# Patient Record
Sex: Female | Born: 1960 | Race: White | Hispanic: No | Marital: Married | Smoking: Never smoker
Health system: Southern US, Community
[De-identification: ages and names within clinical notes are randomized; demographics above are authoritative.]

## PROBLEM LIST (undated history)

## (undated) DIAGNOSIS — R51 Headache: Secondary | ICD-10-CM

## (undated) DIAGNOSIS — N200 Calculus of kidney: Secondary | ICD-10-CM

---

## 2000-08-09 ENCOUNTER — Emergency Department (HOSPITAL_COMMUNITY): Admission: EM | Admit: 2000-08-09 | Discharge: 2000-08-09 | Payer: Self-pay | Admitting: Emergency Medicine

## 2000-08-09 ENCOUNTER — Encounter: Payer: Self-pay | Admitting: Emergency Medicine

## 2002-02-01 ENCOUNTER — Encounter: Payer: Self-pay | Admitting: Otolaryngology

## 2002-02-01 ENCOUNTER — Ambulatory Visit (HOSPITAL_COMMUNITY): Admission: RE | Admit: 2002-02-01 | Discharge: 2002-02-01 | Payer: Self-pay | Admitting: Otolaryngology

## 2007-08-09 ENCOUNTER — Emergency Department (HOSPITAL_COMMUNITY): Admission: EM | Admit: 2007-08-09 | Discharge: 2007-08-09 | Payer: Self-pay | Admitting: Emergency Medicine

## 2009-10-16 ENCOUNTER — Emergency Department (HOSPITAL_COMMUNITY): Admission: EM | Admit: 2009-10-16 | Discharge: 2009-10-17 | Payer: Self-pay | Admitting: Emergency Medicine

## 2009-10-27 ENCOUNTER — Ambulatory Visit (HOSPITAL_COMMUNITY): Admission: RE | Admit: 2009-10-27 | Discharge: 2009-10-27 | Payer: Self-pay | Admitting: Otolaryngology

## 2010-04-22 ENCOUNTER — Ambulatory Visit
Admission: RE | Admit: 2010-04-22 | Discharge: 2010-04-22 | Payer: Self-pay | Source: Home / Self Care | Attending: Otolaryngology | Admitting: Otolaryngology

## 2011-01-04 LAB — COMPREHENSIVE METABOLIC PANEL
ALT: 27
AST: 20
Albumin: 3.3 — ABNORMAL LOW
Calcium: 9
GFR calc Af Amer: >60
Potassium: 3.7
Sodium: 139
Total Protein: 6.5

## 2011-01-04 LAB — CBC
MCHC: 35.1
RDW: 14.3

## 2011-01-04 LAB — URINE CULTURE

## 2011-01-04 LAB — URINALYSIS, ROUTINE W REFLEX MICROSCOPIC
Bilirubin Urine: NEGATIVE
Glucose, UA: 250 — AB
Hgb urine dipstick: NEGATIVE
Nitrite: POSITIVE — AB
Specific Gravity, Urine: 1.025
pH: 5

## 2011-01-04 LAB — DIFFERENTIAL
Eosinophils Absolute: 0.1
Eosinophils Relative: 1
Lymphs Abs: 1.8
Monocytes Absolute: 0.6
Monocytes Relative: 7

## 2011-01-04 LAB — URINE MICROSCOPIC-ADD ON

## 2011-01-04 LAB — PREGNANCY, URINE: Preg Test, Ur: NEGATIVE

## 2011-03-04 ENCOUNTER — Other Ambulatory Visit: Payer: Self-pay | Admitting: Obstetrics and Gynecology

## 2011-03-04 DIAGNOSIS — R928 Other abnormal and inconclusive findings on diagnostic imaging of breast: Secondary | ICD-10-CM

## 2011-03-17 ENCOUNTER — Ambulatory Visit
Admission: RE | Admit: 2011-03-17 | Discharge: 2011-03-17 | Disposition: A | Discharge status: Home / Self Care | Payer: PRIVATE HEALTH INSURANCE | Source: Ambulatory Visit | Attending: Obstetrics and Gynecology | Admitting: Obstetrics and Gynecology

## 2011-03-17 ENCOUNTER — Other Ambulatory Visit: Payer: Self-pay | Admitting: Obstetrics and Gynecology

## 2011-03-17 DIAGNOSIS — R928 Other abnormal and inconclusive findings on diagnostic imaging of breast: Secondary | ICD-10-CM

## 2012-02-02 ENCOUNTER — Telehealth: Payer: Self-pay

## 2012-02-02 NOTE — Telephone Encounter (Signed)
LMOM to call.

## 2012-02-17 ENCOUNTER — Telehealth: Payer: Self-pay | Admitting: Gastroenterology

## 2012-02-17 NOTE — Telephone Encounter (Signed)
Patient was calling to be triaged. She received a letter from DS. Please call patient on Monday at [phone]

## 2012-02-20 ENCOUNTER — Telehealth: Payer: Self-pay

## 2012-02-20 NOTE — Telephone Encounter (Signed)
See triage dated 02/20/2012.

## 2012-02-20 NOTE — Telephone Encounter (Signed)
See triage 02/20/2012.

## 2012-02-20 NOTE — Telephone Encounter (Signed)
Returned pt's call from 02/17/2012. LMOM at [phone] for a return call.

## 2012-02-21 ENCOUNTER — Telehealth: Payer: Self-pay | Admitting: *Deleted

## 2012-02-21 NOTE — Telephone Encounter (Signed)
Pt was referred by Dr. McGough for screening colonoscopy/  

## 2012-02-21 NOTE — Telephone Encounter (Signed)
LMOM to call.

## 2012-02-21 NOTE — Telephone Encounter (Signed)
Sherry Pitts called today to be triaged for a colonoscopy. Please call her back. Thanks.

## 2012-02-21 NOTE — Telephone Encounter (Signed)
Returned pt's phone call. LMOM for her to return call.

## 2012-02-23 NOTE — Telephone Encounter (Signed)
LMOM to call.

## 2012-02-24 NOTE — Telephone Encounter (Signed)
LMOM to call.

## 2012-02-27 NOTE — Telephone Encounter (Signed)
Letter to pt

## 2012-02-27 NOTE — Telephone Encounter (Signed)
Letter to pt and PCP.  

## 2012-03-01 ENCOUNTER — Other Ambulatory Visit: Payer: Self-pay

## 2012-03-01 ENCOUNTER — Telehealth: Payer: Self-pay

## 2012-03-01 DIAGNOSIS — Z139 Encounter for screening, unspecified: Secondary | ICD-10-CM

## 2012-03-01 NOTE — Telephone Encounter (Signed)
Gastroenterology Pre-Procedure Form     Request Date: 03/01/2012     Requesting Physician: Dr. Regino Schultze     PATIENT INFORMATION:  Sherry Pitts is a 51 y.o., female (DOB=[birth year]).  PROCEDURE: Procedure(s) requested: colonoscopy Procedure Reason: screening for colon cancer  PATIENT REVIEW QUESTIONS: The patient reports the following:   1. Diabetes Melitis: no 2. Joint replacements in the past 12 months: no 3. Major health problems in the past 3 months: no 4. Has an artificial valve or MVP:no 5. Has been advised in past to take antibiotics in advance of a procedure like teeth cleaning: no}    MEDICATIONS & ALLERGIES:    Patient reports the following regarding taking any blood thinners:   Plavix? no Aspirin?no Coumadin?  no  Patient confirms/reports the following medications:  Current Outpatient Prescriptions  Medication Sig Dispense Refill  . Multiple Vitamin (MULTIVITAMIN) tablet Take 1 tablet by mouth daily.        Patient confirms/reports the following allergies:  Allergies  Allergen Reactions  . Ceclor (Cefaclor) Hives  . Penicillins Hives  . Sulfur Hives    Patient is appropriate to schedule for requested procedure(s): yes  AUTHORIZATION INFORMATION Primary Insurance:   ID #:  Group #:  Pre-Cert / Auth required: Pre-Cert / Auth #:   Secondary Insurance:   ID #:   Group #:  Pre-Cert / Auth required: Pre-Cert / Auth #:   No orders of the defined types were placed in this encounter.    SCHEDULE INFORMATION: Procedure has been scheduled as follows:  Date: 03/12/2012    Time: 12:30 PM  Location: [facility] Short Stay  This Gastroenterology Pre-Precedure Form is being routed to the following provider(s) for review: Jonette Eva, MD

## 2012-03-02 NOTE — Telephone Encounter (Signed)
PREPOPIK-DRINK WATER TO KEEP URINE LIGHT YELLOW.  PT SHOULD DROP OFF RX 3 DAYS PRIOR TO PROCEDURE.  

## 2012-03-05 MED ORDER — SOD PICOSULFATE-MAG OX-CIT ACD 10-3.5-12 MG-GM-GM PO PACK: 1.0000 | PACK | Freq: Once | ORAL | Status: DC

## 2012-03-06 NOTE — Telephone Encounter (Signed)
Rx was sent to [facility] pharmacy. LMOM for pt to come by office to pick up instructions and left message that we close tomorrow. Before I could finish this, pt called and rescheduled to 03/23/2012 at 11:15 AM. Selena Batten has been informed. I doing a new instructions with new time, etc and mailing to her.

## 2012-03-07 ENCOUNTER — Telehealth: Payer: Self-pay

## 2012-03-07 NOTE — Telephone Encounter (Signed)
Called [employer]  [employer] at [phone]. Spoke to JD then was trnasferred to [employer] and spoke to [employer] who said pre-cert is required for screening colonoscopy. PA number is [phone].

## 2012-03-22 MED ORDER — SODIUM CHLORIDE 0.45 % IV SOLN
INTRAVENOUS | Status: DC
  Administered 2012-03-23: 1000 mL via INTRAVENOUS

## 2012-03-23 ENCOUNTER — Ambulatory Visit (HOSPITAL_COMMUNITY)
Admission: RE | Admit: 2012-03-23 | Discharge: 2012-03-23 | Disposition: A | Discharge status: Home / Self Care | Payer: PRIVATE HEALTH INSURANCE | Source: Ambulatory Visit | Attending: Gastroenterology | Admitting: Gastroenterology

## 2012-03-23 ENCOUNTER — Encounter (HOSPITAL_COMMUNITY): Payer: Self-pay | Admitting: *Deleted

## 2012-03-23 ENCOUNTER — Encounter (HOSPITAL_COMMUNITY)
Admission: RE | Disposition: A | Discharge status: Home / Self Care | Payer: Self-pay | Source: Ambulatory Visit | Attending: Gastroenterology

## 2012-03-23 DIAGNOSIS — Z1211 Encounter for screening for malignant neoplasm of colon: Secondary | ICD-10-CM | POA: Insufficient documentation

## 2012-03-23 DIAGNOSIS — D128 Benign neoplasm of rectum: Secondary | ICD-10-CM | POA: Insufficient documentation

## 2012-03-23 DIAGNOSIS — D129 Benign neoplasm of anus and anal canal: Secondary | ICD-10-CM | POA: Insufficient documentation

## 2012-03-23 DIAGNOSIS — D126 Benign neoplasm of colon, unspecified: Secondary | ICD-10-CM | POA: Insufficient documentation

## 2012-03-23 DIAGNOSIS — K648 Other hemorrhoids: Secondary | ICD-10-CM | POA: Insufficient documentation

## 2012-03-23 DIAGNOSIS — Z139 Encounter for screening, unspecified: Secondary | ICD-10-CM

## 2012-03-23 HISTORY — PX: COLONOSCOPY: SHX5424

## 2012-03-23 HISTORY — DX: Headache: R51

## 2012-03-23 SURGERY — COLONOSCOPY
Anesthesia: Moderate Sedation

## 2012-03-23 MED ORDER — MIDAZOLAM HCL 5 MG/5ML IJ SOLN
INTRAMUSCULAR | Status: AC
  Filled 2012-03-23: qty 10

## 2012-03-23 MED ORDER — MEPERIDINE HCL 100 MG/ML IJ SOLN
INTRAMUSCULAR | Status: DC | PRN
  Administered 2012-03-23 (×3): 25 mg via INTRAVENOUS

## 2012-03-23 MED ORDER — SIMETHICONE 40 MG/0.6ML PO SUSP
ORAL | Status: DC | PRN
  Administered 2012-03-23: 11:00:00

## 2012-03-23 MED ORDER — MIDAZOLAM HCL 5 MG/5ML IJ SOLN
INTRAMUSCULAR | Status: DC | PRN
  Administered 2012-03-23 (×2): 1 mg via INTRAVENOUS
  Administered 2012-03-23 (×2): 2 mg via INTRAVENOUS
  Administered 2012-03-23: 1 mg via INTRAVENOUS

## 2012-03-23 MED ORDER — MEPERIDINE HCL 100 MG/ML IJ SOLN
INTRAMUSCULAR | Status: AC
  Filled 2012-03-23: qty 1

## 2012-03-23 NOTE — Op Note (Signed)
[facility] [address] [place], [address]   COLONOSCOPY PROCEDURE REPORT  PATIENT: Sherry Pitts, Sherry Pitts  MR#: [id number] BIRTHDATE: [birth year] , 51  yrs. old GENDER: Female ENDOSCOPIST: Jonette Eva, MD REFERRED [id number] Regino Schultze, M.D. PROCEDURE DATE:  03/23/2012 PROCEDURE:   Colonoscopy with biopsy and Colonoscopy with cold biopsy polypectomy INDICATIONS:Average risk patient for colon cancer and PT USES OTC HA RELIEF MEDS AND IBUPROFEN.. MEDICATIONS: Demerol 75 mg IV and Versed 6 mg IV  DESCRIPTION OF PROCEDURE:    Physical exam was performed.  Informed consent was obtained from the patient after explaining the benefits, risks, and alternatives to procedure.  The patient was connected to monitor and placed in left lateral position. Continuous oxygen was provided by nasal cannula and IV medicine administered through an indwelling cannula.  After administration of sedation and rectal exam, the patients rectum was intubated and the Pentax Colonoscope [phone]  colonoscope was advanced under direct visualization to the cecum.  The scope was removed slowly by carefully examining the color, texture, anatomy, and integrity mucosa on the way out.  The patient was recovered in endoscopy and discharged home in satisfactory condition.       COLON FINDINGS: Three sessile polyps were found in the rectum, at the cecum, and in the descending colon.  , Abnormal mucosa was found in the descending colon and sigmoid colon.  The mucosa was erythematous, ulcerated and had erosions.  Multiple biopsies were performed using cold forceps.  , The colon mucosa was otherwise normal.  , and Small internal hemorrhoids were found.  PREP QUALITY: good. CECAL W/D TIME: 15 minutes  COMPLICATIONS: None  ENDOSCOPIC IMPRESSION: SMALL COLON POLYPS MILD COLITIS LIKELY DUE TO NSAID COLOPATHY HEMORRHOIDS   RECOMMENDATIONS: AWAIT BIOPSY. IF NSAID INUDCUED CHANGES IN COLON, PT SHOULD  CONSIDER USING AN NSAID IN ANOTHER CLASS. HIGH FIBER DIET TCS IN 10 YEARS       _______________________________ eSignedJonette Eva, MD 03/23/2012 12:03 PM     PATIENT NAME:  Sherry Pitts, Sherry Pitts MR#: [id number]

## 2012-03-23 NOTE — H&P (Signed)
  Primary Care Physician:  Kirk Ruths, MD Primary Gastroenterologist:  Dr. Darrick Penna  Pre-Procedure History & Physical: HPI:  Sherry Pitts is a 51 y.o. female here for COLON CANCER SCREENING.  Past Medical History  Diagnosis Date  . Headache     migraines    History reviewed. No pertinent past surgical history.  Prior to Admission medications   Medication Sig Start Date End Date Taking? Authorizing Provider  Multiple Vitamin (MULTIVITAMIN) tablet Take 1 tablet by mouth daily.    Historical Provider, MD    Allergies as of 03/01/2012 - Review Complete 03/01/2012  Allergen Reaction Noted  . Ceclor (cefaclor) Hives 03/01/2012  . Penicillins Hives 03/01/2012  . Sulfur Hives 03/01/2012    History reviewed. No pertinent family history.  History   Social History  . Marital Status: Married    Spouse Name: N/A    Number of Children: N/A  . Years of Education: N/A   Occupational History  . Not on file.   Social History Main Topics  . Smoking status: Never Smoker   . Smokeless tobacco: Not on file  . Alcohol Use: No  . Drug Use: No  . Sexually Active: Yes   Other Topics Concern  . Not on file   Social History Narrative  . No narrative on file    Review of Systems: See HPI, otherwise negative ROS   Physical Exam: BP 124/75  Pulse 74  Temp 98 F (36.7 C) (Oral)  Resp 15  Ht 5\' 3"  (1.6 m)  Wt 160 lb (72.576 kg)  BMI 28.34 kg/m2  SpO2 97% General:   Alert,  pleasant and cooperative in NAD Head:  Normocephalic and atraumatic. Neck:  Supple; Lungs:  Clear throughout to auscultation.    Heart:  Regular rate and rhythm. Abdomen:  Soft, nontender and nondistended. Normal bowel sounds, without guarding, and without rebound.   Neurologic:  Alert and  oriented x4;  grossly normal neurologically.  Impression/Plan:     SCREENING  Plan:  1. TCS TODAY

## 2012-03-27 ENCOUNTER — Encounter (HOSPITAL_COMMUNITY): Payer: Self-pay | Admitting: Gastroenterology

## 2012-03-27 DIAGNOSIS — D126 Benign neoplasm of colon, unspecified: Secondary | ICD-10-CM

## 2012-03-27 DIAGNOSIS — K5289 Other specified noninfective gastroenteritis and colitis: Secondary | ICD-10-CM

## 2012-03-27 DIAGNOSIS — Z1211 Encounter for screening for malignant neoplasm of colon: Secondary | ICD-10-CM

## 2012-03-27 DIAGNOSIS — K648 Other hemorrhoids: Secondary | ICD-10-CM

## 2012-03-28 ENCOUNTER — Telehealth: Payer: Self-pay | Admitting: Gastroenterology

## 2012-03-28 NOTE — Telephone Encounter (Signed)
LMOM to call.

## 2012-03-28 NOTE — Telephone Encounter (Signed)
Please call pt. She had simple adenomas removed from her colon. HER COLON BIOPSIES SHOW CHANGES THAT MEAN THAT HER OTC HA MED AND IBUPROFEN ARE IRRITATING HER COLON. SHE SHOULD CONSIDER USING NSAIDS IN A DIFFERENT CLASS. SHE SHOULD DISCUSS THIS WITH DR. Regino Schultze. FOLLOW A HIGH FIBER DIET. TCS IN 10 YEARS.

## 2012-03-28 NOTE — Telephone Encounter (Signed)
Pt aware of results 

## 2012-03-28 NOTE — Telephone Encounter (Signed)
Path faxed to PCP, recall made 

## 2012-09-13 ENCOUNTER — Emergency Department (HOSPITAL_COMMUNITY)
Admission: EM | Admit: 2012-09-13 | Discharge: 2012-09-13 | Disposition: A | Discharge status: Home / Self Care | Payer: PRIVATE HEALTH INSURANCE | Attending: Emergency Medicine | Admitting: Emergency Medicine

## 2012-09-13 ENCOUNTER — Emergency Department (HOSPITAL_COMMUNITY): Payer: PRIVATE HEALTH INSURANCE

## 2012-09-13 ENCOUNTER — Encounter (HOSPITAL_COMMUNITY): Payer: Self-pay | Admitting: Emergency Medicine

## 2012-09-13 DIAGNOSIS — R109 Unspecified abdominal pain: Secondary | ICD-10-CM | POA: Insufficient documentation

## 2012-09-13 DIAGNOSIS — Z79899 Other long term (current) drug therapy: Secondary | ICD-10-CM | POA: Insufficient documentation

## 2012-09-13 DIAGNOSIS — R319 Hematuria, unspecified: Secondary | ICD-10-CM | POA: Insufficient documentation

## 2012-09-13 DIAGNOSIS — Z880 Allergy status to penicillin: Secondary | ICD-10-CM | POA: Insufficient documentation

## 2012-09-13 DIAGNOSIS — N898 Other specified noninflammatory disorders of vagina: Secondary | ICD-10-CM | POA: Insufficient documentation

## 2012-09-13 DIAGNOSIS — R300 Dysuria: Secondary | ICD-10-CM

## 2012-09-13 DIAGNOSIS — Z87442 Personal history of urinary calculi: Secondary | ICD-10-CM | POA: Insufficient documentation

## 2012-09-13 HISTORY — DX: Calculus of kidney: N20.0

## 2012-09-13 LAB — URINALYSIS, ROUTINE W REFLEX MICROSCOPIC
Leukocytes, UA: NEGATIVE
Protein, ur: NEGATIVE mg/dL
Urobilinogen, UA: 0.2 mg/dL (ref 0.0–1.0)

## 2012-09-13 LAB — WET PREP, GENITAL
Clue Cells Wet Prep HPF POC: NONE SEEN
Trich, Wet Prep: NONE SEEN
WBC, Wet Prep HPF POC: NONE SEEN

## 2012-09-13 NOTE — ED Notes (Signed)
Patient c/o urinary retention with blood noted in urine. Per patient hx of kidney stones. Patient reports pressure in lower abd. Patient seen by Pcp 3 weeks ago in which she states he thought it was a kidney stone. Patient denies having passed a kidney stone since.

## 2012-09-13 NOTE — ED Notes (Signed)
Pt presents with " pain in urethra". Pt states was seen in PCP's office 3 weeks ago for rt flank pain. Denies UTI diagnosis at that time. Pt reports having kidney stones in past. States has pressure in bladder and has " bleeding " at this time. Post menopausal x 4 yrs. Denies fever, N/V at this time. NAD noted.

## 2012-09-13 NOTE — ED Provider Notes (Signed)
History     CSN: [id number]  Arrival date & time 09/13/12  1518   First MD Initiated Contact with Patient 09/13/12 1630      Chief Complaint  Patient presents with  . Urinary Retention  . Hematuria    HPI Pt was seen at 1700.  Per pt, c/o gradual onset and persistence of constant lower abd "pain" that began 3 weeks ago. Pt describes the pain as "pressure." Has been associated with hematuria and dysuria. States she was eval by her PMD 2 weeks ago for same, dx "possible kidney stone" and "possible UTI," was tx with cipro x10 days. Stats she "has at least a week of pills left."  States she is now having vaginal discharge since being on the antibiotics.  Denies flank/back pain, no N/V/D, no fevers, no CP/SOB, no rash.      Past Medical History  Diagnosis Date  . Headache(784.0)     migraines  . Kidney stone     Past Surgical History  Procedure Laterality Date  . Colonoscopy  03/23/2012    Procedure: COLONOSCOPY;  Surgeon: West Bali, MD;  Location: [facility];  Service: Endoscopy;  Laterality: N/A;  12:30 -rescheduled to 11:15 AM Doris notified pt    Family History  Problem Relation Age of Onset  . Heart failure Other   . Hypertension Other     History  Substance Use Topics  . Smoking status: Never Smoker   . Smokeless tobacco: Never Used  . Alcohol Use: No    OB History   Grav Para Term Preterm Abortions TAB SAB Ect Mult Living   2 2 2       2       Review of Systems ROS: Statement: All systems negative except as marked or noted in the HPI; Constitutional: Negative for fever and chills. ; ; Eyes: Negative for eye pain, redness and discharge. ; ; ENMT: Negative for ear pain, hoarseness, nasal congestion, sinus pressure and sore throat. ; ; Cardiovascular: Negative for chest pain, palpitations, diaphoresis, dyspnea and peripheral edema. ; ; Respiratory: Negative for cough, wheezing and stridor. ; ; Gastrointestinal: +abd pain. Negative for nausea, vomiting,  diarrhea, blood in stool, hematemesis, jaundice and rectal bleeding. . ; ; Genitourinary: +dysuria, hematuria. Negative for flank pain and hematuria. ; ; GYN:  No vaginal bleeding,+vaginal discharge, no vulvar pain.;; Musculoskeletal: Negative for back pain and neck pain. Negative for swelling and trauma.; ; Skin: Negative for pruritus, rash, abrasions, blisters, bruising and skin lesion.; ; Neuro: Negative for headache, lightheadedness and neck stiffness. Negative for weakness, altered level of consciousness , altered mental status, extremity weakness, paresthesias, involuntary movement, seizure and syncope.       Allergies  Ceclor; Penicillins; and Sulfur  Home Medications   Current Outpatient Rx  Name  Route  Sig  Dispense  Refill  . azithromycin (ZITHROMAX Z-PAK) 250 MG tablet   Oral   Take 250-500 mg by mouth daily. Take two tablets by mouth on day 1, then take one tablet on days 2 through 5         . ciprofloxacin (CIPRO) 500 MG tablet   Oral   Take 500 mg by mouth 2 (two) times daily.         . miconazole (MONISTAT-3) KIT   Vaginal   Place 1 kit vaginally at bedtime.         . Multiple Vitamin (MULTIVITAMIN) tablet   Oral   Take 1 tablet by mouth daily.         Marland Kitchen  Propylene Glycol-Glycerin (SOOTHE) 0.6-0.6 % SOLN   Ophthalmic   Apply 1 drop to eye daily as needed.           BP 120/72  Pulse 77  Temp(Src) 98.6 F (37 C) (Oral)  Resp 16  Ht 5\' 3"  (1.6 m)  Wt 165 lb (74.844 kg)  BMI 29.24 kg/m2  SpO2 98%  Physical Exam 1705: Physical examination:  Nursing notes reviewed; Vital signs and O2 SAT reviewed;  Constitutional: Well developed, Well nourished, Well hydrated, In no acute distress; Head:  Normocephalic, atraumatic; Eyes: EOMI, PERRL, No scleral icterus; ENMT: Mouth and pharynx normal, Mucous membranes moist; Neck: Supple, Full range of motion, No lymphadenopathy; Cardiovascular: Regular rate and rhythm, No murmur, rub, or gallop; Respiratory: Breath  sounds clear & equal bilaterally, No rales, rhonchi, wheezes.  Speaking full sentences with ease, Normal respiratory effort/excursion; Chest: Nontender, Movement normal; Abdomen: Soft, +mild suprapubic tenderness to palp. No rebound or guarding. Nondistended, Normal bowel sounds; Genitourinary: No CVA tenderness. Pelvic exam performed with permission of pt and female ED tech assist during exam.  External genitalia w/o lesions. Vaginal vault without discharge, scant amount of dark blood.  Cervix w/o lesions, not friable, GC/chlam and wet prep obtained and sent to lab.  Bimanual exam w/o CMT, uterine or adnexal tenderness.;;; Extremities: Pulses normal, No tenderness, No edema, No calf edema or asymmetry.; Neuro: AA&Ox3, Major CN grossly intact.  Speech clear. No gross focal motor or sensory deficits in extremities.; Skin: Color normal, Warm, Dry.   ED Course  Procedures    MDM  MDM Reviewed: previous chart, vitals and nursing note Interpretation: labs and CT scan   Results for orders placed during the hospital encounter of 09/13/12  WET PREP, GENITAL      Result Value Range   Yeast Wet Prep HPF POC NONE SEEN  NONE SEEN   Trich, Wet Prep NONE SEEN  NONE SEEN   Clue Cells Wet Prep HPF POC NONE SEEN  NONE SEEN   WBC, Wet Prep HPF POC NONE SEEN  NONE SEEN  URINALYSIS, ROUTINE W REFLEX MICROSCOPIC      Result Value Range   Color, Urine YELLOW  YELLOW   APPearance HAZY (*) CLEAR   Specific Gravity, Urine >1.030 (*) 1.005 - 1.030   pH 6.0  5.0 - 8.0   Glucose, UA NEGATIVE  NEGATIVE mg/dL   Hgb urine dipstick MODERATE (*) NEGATIVE   Bilirubin Urine NEGATIVE  NEGATIVE   Ketones, ur TRACE (*) NEGATIVE mg/dL   Protein, ur NEGATIVE  NEGATIVE mg/dL   Urobilinogen, UA 0.2  0.0 - 1.0 mg/dL   Nitrite NEGATIVE  NEGATIVE   Leukocytes, UA NEGATIVE  NEGATIVE  URINE MICROSCOPIC-ADD ON      Result Value Range   Squamous Epithelial / LPF FEW (*) RARE   WBC, UA 0-2  <3 WBC/hpf   RBC / HPF 0-2  <3  RBC/hpf   Ct Abdomen Pelvis Wo Contrast 09/13/2012   *RADIOLOGY REPORT*  Clinical Data: Right-sided flank pain for 3 weeks  CT ABDOMEN AND PELVIS WITHOUT CONTRAST  Technique:  Multidetector CT imaging of the abdomen and pelvis was performed following the standard protocol without intravenous contrast.  Comparison: 08/09/2007  Findings: Lung bases are clear.  1 mm nonobstructing left lower renal pole calculus identified image 37.  No radiopaque right renal or right or left ureteral calculus. No bladder calculus.  Abdominal viscera otherwise unremarkable allowing for lack of contrast.  Uterus and ovaries are normal.  No  free air, ascites, or lymphadenopathy.  Normal appendix.  No acute osseous finding.  IMPRESSION: No acute intra-abdominal or pelvic pathology.  1 mm left lower renal pole nonobstructing calculus.   Original Report Authenticated By: Christiana Pellant, M.D.    6578:  No acute findings on workup today. No clear UTI on Udip; UC pending. Pt will continue her cipro. Wants to go home now. Dx and testing d/w pt and family.  Questions answered.  Verb understanding, agreeable to d/c home with outpt f/u.          Laray Anger, DO 09/15/12 1232

## 2012-09-14 LAB — GC/CHLAMYDIA PROBE AMP
CT Probe RNA: NEGATIVE
GC Probe RNA: NEGATIVE

## 2012-09-15 LAB — URINE CULTURE

## 2012-10-25 ENCOUNTER — Other Ambulatory Visit: Payer: Self-pay | Admitting: Obstetrics and Gynecology

## 2012-10-26 ENCOUNTER — Other Ambulatory Visit: Payer: Self-pay | Admitting: Obstetrics and Gynecology

## 2012-10-26 DIAGNOSIS — R928 Other abnormal and inconclusive findings on diagnostic imaging of breast: Secondary | ICD-10-CM

## 2013-11-01 ENCOUNTER — Other Ambulatory Visit: Payer: Self-pay | Admitting: Obstetrics and Gynecology

## 2013-11-05 LAB — CYTOLOGY - PAP

## 2014-02-10 ENCOUNTER — Encounter (HOSPITAL_COMMUNITY): Payer: Self-pay | Admitting: Emergency Medicine

## 2014-11-25 ENCOUNTER — Other Ambulatory Visit: Payer: Self-pay | Admitting: Obstetrics and Gynecology

## 2014-11-25 DIAGNOSIS — R928 Other abnormal and inconclusive findings on diagnostic imaging of breast: Secondary | ICD-10-CM

## 2014-11-26 LAB — CYTOLOGY - PAP

## 2014-12-01 ENCOUNTER — Other Ambulatory Visit: Payer: PRIVATE HEALTH INSURANCE

## 2015-01-15 ENCOUNTER — Inpatient Hospital Stay: Admission: RE | Admit: 2015-01-15 | Payer: PRIVATE HEALTH INSURANCE | Source: Ambulatory Visit

## 2015-01-15 ENCOUNTER — Other Ambulatory Visit: Payer: PRIVATE HEALTH INSURANCE

## 2015-01-23 ENCOUNTER — Other Ambulatory Visit: Payer: PRIVATE HEALTH INSURANCE

## 2015-01-30 ENCOUNTER — Other Ambulatory Visit: Payer: PRIVATE HEALTH INSURANCE

## 2015-04-27 ENCOUNTER — Ambulatory Visit
Admission: RE | Admit: 2015-04-27 | Discharge: 2015-04-27 | Disposition: A | Discharge status: Home / Self Care | Payer: PRIVATE HEALTH INSURANCE | Source: Ambulatory Visit | Attending: Obstetrics and Gynecology | Admitting: Obstetrics and Gynecology

## 2015-04-27 DIAGNOSIS — R928 Other abnormal and inconclusive findings on diagnostic imaging of breast: Secondary | ICD-10-CM

## 2016-01-04 ENCOUNTER — Other Ambulatory Visit: Payer: Self-pay | Admitting: Obstetrics and Gynecology

## 2016-01-05 LAB — CYTOLOGY - PAP

## 2016-03-28 ENCOUNTER — Other Ambulatory Visit (HOSPITAL_COMMUNITY): Payer: Self-pay | Admitting: Internal Medicine

## 2016-03-28 DIAGNOSIS — Z1231 Encounter for screening mammogram for malignant neoplasm of breast: Secondary | ICD-10-CM

## 2016-04-27 ENCOUNTER — Ambulatory Visit (HOSPITAL_COMMUNITY): Payer: PRIVATE HEALTH INSURANCE

## 2016-04-29 ENCOUNTER — Ambulatory Visit (HOSPITAL_COMMUNITY)
Admission: RE | Admit: 2016-04-29 | Discharge: 2016-04-29 | Disposition: A | Discharge status: Home / Self Care | Payer: PRIVATE HEALTH INSURANCE | Source: Ambulatory Visit | Attending: Internal Medicine | Admitting: Internal Medicine

## 2016-04-29 DIAGNOSIS — Z1231 Encounter for screening mammogram for malignant neoplasm of breast: Secondary | ICD-10-CM | POA: Insufficient documentation

## 2016-07-22 ENCOUNTER — Emergency Department (HOSPITAL_COMMUNITY)
Admission: EM | Admit: 2016-07-22 | Discharge: 2016-07-23 | Disposition: A | Discharge status: Home / Self Care | Payer: PRIVATE HEALTH INSURANCE | Attending: Emergency Medicine | Admitting: Emergency Medicine

## 2016-07-22 ENCOUNTER — Encounter (HOSPITAL_COMMUNITY): Payer: Self-pay

## 2016-07-22 DIAGNOSIS — T7840XA Allergy, unspecified, initial encounter: Secondary | ICD-10-CM | POA: Diagnosis present

## 2016-07-22 LAB — CBC WITH DIFFERENTIAL/PLATELET
BASOS ABS: 0 10*3/uL (ref 0.0–0.1)
BASOS PCT: 0 %
Eosinophils Absolute: 0.2 10*3/uL (ref 0.0–0.7)
Eosinophils Relative: 3 %
HCT: 38.9 % (ref 36.0–46.0)
Hemoglobin: 12.8 g/dL (ref 12.0–15.0)
Lymphocytes Relative: 28 %
Lymphs Abs: 2.1 10*3/uL (ref 0.7–4.0)
MCH: 28.4 pg (ref 26.0–34.0)
MCHC: 32.9 g/dL (ref 30.0–36.0)
MCV: 86.4 fL (ref 78.0–100.0)
MONO ABS: 0.4 10*3/uL (ref 0.1–1.0)
Monocytes Relative: 5 %
NEUTROS ABS: 4.9 10*3/uL (ref 1.7–7.7)
Neutrophils Relative %: 64 %
PLATELETS: 286 10*3/uL (ref 150–400)
RBC: 4.5 MIL/uL (ref 3.87–5.11)
RDW: 14.1 % (ref 11.5–15.5)
WBC: 7.6 10*3/uL (ref 4.0–10.5)

## 2016-07-22 MED ORDER — METHYLPREDNISOLONE SODIUM SUCC 125 MG IJ SOLR
125.0000 mg | Freq: Once | INTRAMUSCULAR | Status: AC
  Administered 2016-07-22: 125 mg via INTRAVENOUS
  Filled 2016-07-22: qty 2

## 2016-07-22 MED ORDER — FAMOTIDINE IN NACL 20-0.9 MG/50ML-% IV SOLN
20.0000 mg | Freq: Once | INTRAVENOUS | Status: AC
  Administered 2016-07-22: 20 mg via INTRAVENOUS
  Filled 2016-07-22: qty 50

## 2016-07-22 MED ORDER — DIPHENHYDRAMINE HCL 50 MG/ML IJ SOLN
25.0000 mg | Freq: Once | INTRAMUSCULAR | Status: AC
  Administered 2016-07-22: 25 mg via INTRAVENOUS
  Filled 2016-07-22: qty 1

## 2016-07-22 NOTE — ED Triage Notes (Signed)
Patient states that she started coughing yesterday and started breaking out and itching today.  Lips have started swelling up per pt.  Patient states that she took 3 teaspoons of children's benadryl.  Patient was started on Nitrofurantoin 100 mg 1 PO BID five days ago.

## 2016-07-22 NOTE — ED Provider Notes (Signed)
[facility] [place] DEPT Provider Note   CSN: [id number] Arrival date & time: 07/22/16  2250   By signing my name below, I, Sherry Pitts, attest that this documentation has been prepared under the direction and in the presence of Ezequiel Essex, MD. Electronically signed, Sherry Pitts, ED Scribe. 07/22/16. 1:11 AM.  History   Chief Complaint Chief Complaint  Patient presents with  . Allergic Reaction   The history is provided by the patient and medical records. No language interpreter was used.    Sherry Pitts is a 56 y.o. female with no pertinent PMHx, transported via her husband to the Emergency Department with concern for multiple complaints x 2 days s/p taking prescribed 14 nitrofurantoin monohydrate x 5 days. Pt c/o BUE and trunk rash, top > lower lip swelling, chest tightness, cough x 2-3 days. She believes this may be d/t an allergic reaction to her prescribed medication. Pt states she took her prescribed medicine ~6:30 today before she ate food she normally eats, leading up to onset of symptoms. Pt states she has been prescribed her currently prescribed medication 1 time before without complications. She adds she took 3 tsp 5 mg childrens benadryl ~10 PM. No reported new exposures, throat tightness, chest pain, fever, headache, SOB, h/o heart disorders, heart attacks or stents.   Past Medical History:  Diagnosis Date  . Headache(784.0)    migraines  . Kidney stone     There are no active problems to display for this patient.   Past Surgical History:  Procedure Laterality Date  . COLONOSCOPY  03/23/2012   Procedure: COLONOSCOPY;  Surgeon: Danie Binder, MD;  Location: [facility];  Service: Endoscopy;  Laterality: N/A;  12:30 -rescheduled to 11:15 AM Doris notified pt    OB History    Gravida Para Term Preterm AB Living   '2 2 2     2   '$ SAB TAB Ectopic Multiple Live Births                   Home Medications    Prior to Admission medications   Medication  Sig Start Date End Date Taking? Authorizing Provider  nitrofurantoin (MACRODANTIN) 100 MG capsule Take 100 mg by mouth 2 (two) times daily.   Yes Historical Provider, MD  azithromycin (ZITHROMAX Z-PAK) 250 MG tablet Take 250-500 mg by mouth daily. Take two tablets by mouth on day 1, then take one tablet on days 2 through 5 09/11/12   Historical Provider, MD  ciprofloxacin (CIPRO) 500 MG tablet Take 500 mg by mouth 2 (two) times daily.    Historical Provider, MD  miconazole (MONISTAT-3) KIT Place 1 kit vaginally at bedtime.    Historical Provider, MD  Multiple Vitamin (MULTIVITAMIN) tablet Take 1 tablet by mouth daily.    Historical Provider, MD  Propylene Glycol-Glycerin (SOOTHE) 0.6-0.6 % SOLN Apply 1 drop to eye daily as needed.    Historical Provider, MD    Family History Family History  Problem Relation Age of Onset  . Heart failure Other   . Hypertension Other     Social History Social History  Substance Use Topics  . Smoking status: Never Smoker  . Smokeless tobacco: Never Used  . Alcohol use No     Allergies   Ceclor [cefaclor]; Penicillins; and Sulfur   Review of Systems Review of Systems All other systems reviewed and all systems are negative for acute changes except as noted in the HPI and PMH.    Physical Exam Updated  Vital Signs BP 140/79 (BP Location: Left Arm)   Pulse 98   Temp 98.2 F (36.8 C) (Oral)   Resp 20   Ht '5\' 3"'$  (1.6 m)   Wt 169 lb (76.7 kg)   SpO2 97%   BMI 29.94 kg/m   Physical Exam  Constitutional: She is oriented to person, place, and time. She appears well-developed and well-nourished. No distress.  HENT:  Head: Normocephalic and atraumatic.  Mouth/Throat: Oropharynx is clear and moist. No oropharyngeal exudate.  Mild lip swelling; no tongue or posterior pharynx swelling   Eyes: Conjunctivae and EOM are normal. Pupils are equal, round, and reactive to light. Right eye exhibits no discharge. Left eye exhibits no discharge. No scleral  icterus.  Neck: Normal range of motion. Neck supple. No JVD present. No tracheal deviation present.  No meningismus.  Cardiovascular: Normal rate, regular rhythm, normal heart sounds and intact distal pulses.   No murmur heard. Pulmonary/Chest: Effort normal and breath sounds normal. No stridor. No respiratory distress. She has no wheezes.  lungs clear, no wheezes  Abdominal: Soft. There is no tenderness. There is no rebound and no guarding.  Musculoskeletal: Normal range of motion. She exhibits no edema or tenderness.  Neurological: She is alert and oriented to person, place, and time. No cranial nerve deficit. She exhibits normal muscle tone. Coordination normal.   5/5 strength throughout. CN 2-12 intact.Equal grip strength.   Skin: Skin is warm and dry. Capillary refill takes less than 2 seconds. Rash ( scattered urticarial rash to arms and trunk) noted.  Nursing note and vitals reviewed.    ED Treatments / Results  DIAGNOSTIC STUDIES: Oxygen Saturation is 97% on RA, NL by my interpretation.    COORDINATION OF CARE: 11:36 PM Discussed treatment plan with pt at bedside and pt agreed to plan. Will order medications and IV fluids.  Labs (all labs ordered are listed, but only abnormal results are displayed) Labs Reviewed  BASIC METABOLIC PANEL - Abnormal; Notable for the following:       Result Value   Glucose, Bld 139 (*)    Creatinine, Ser 1.04 (*)    GFR calc non Af Amer 58 (*)    All other components within normal limits  CBC WITH DIFFERENTIAL/PLATELET  TROPONIN I    EKG  EKG Interpretation  Date/Time:  Saturday July 23 2016 00:21:36 EDT Ventricular Rate:  82 PR Interval:    QRS Duration: 93 QT Interval:  377 QTC Calculation: 441 R Axis:   112 Text Interpretation:  Sinus rhythm Right axis deviation Low voltage, precordial leads No previous ECGs available Confirmed by Wyvonnia Dusky  MD, Mansel Strother ([id number]) on 07/23/2016 12:47:55 AM       Radiology No results  found.  Procedures Procedures (including critical care time)  Medications Ordered in ED Medications  famotidine (PEPCID) IVPB 20 mg premix (0 mg Intravenous Stopped 07/23/16 0027)  methylPREDNISolone sodium succinate (SOLU-MEDROL) 125 mg/2 mL injection 125 mg (125 mg Intravenous Given 07/22/16 2353)  diphenhydrAMINE (BENADRYL) injection 25 mg (25 mg Intravenous Given 07/22/16 2353)     Initial Impression / Assessment and Plan / ED Course  I have reviewed the triage vital signs and the nursing notes.  Pertinent labs & imaging results that were available during my care of the patient were reviewed by me and considered in my medical decision making (see chart for details).     Patient complains of itchy rash and lip swelling onset tonight around 7 PM. This started after she was  eating salmon for dinner. She is concerned she is having allergic reaction to nitrofurantoin which she has been on for 5 days. Denies chest pain or shortness of breath.  Patient given antihistamines and steroids. No tongue or lip swelling. She is not on ACE inhibitor. No chest pain or shortness of breath.   Recheck 1:15 AM. Patient improving. States the swelling has decreased. Rash is resolved. Feels improved.  Uncertain whether related to nitrofurantoin or to food. Advised to stop nitrofurantoin.  Continue steroids and antihistamines. Patient given epinephrine pen and instructions for use. Instructed to stop nitrofurantoin. Follow-up with PCP.  Patient anxious to leave. States she is feeling better. Return precautions discussed.   Final Clinical Impressions(s) / ED Diagnoses   Final diagnoses:  Allergic reaction, initial encounter    New Prescriptions New Prescriptions   No medications on file  I personally performed the services described in this documentation, which was scribed in my presence. The recorded information has been reviewed and is accurate.    Ezequiel Essex, MD 07/23/16 [phone]

## 2016-07-23 LAB — BASIC METABOLIC PANEL
Anion gap: 8 (ref 5–15)
BUN: 18 mg/dL (ref 6–20)
CALCIUM: 9.8 mg/dL (ref 8.9–10.3)
CO2: 29 mmol/L (ref 22–32)
CREATININE: 1.04 mg/dL — AB (ref 0.44–1.00)
Chloride: 104 mmol/L (ref 101–111)
GFR calc non Af Amer: 58 mL/min — ABNORMAL LOW (ref >60)
Glucose, Bld: 139 mg/dL — ABNORMAL HIGH (ref 65–99)
Potassium: 3.5 mmol/L (ref 3.5–5.1)
SODIUM: 141 mmol/L (ref 135–145)

## 2016-07-23 LAB — TROPONIN I

## 2016-07-23 MED ORDER — FAMOTIDINE 20 MG PO TABS: 20.0000 mg | ORAL_TABLET | Freq: Two times a day (BID) | ORAL | 0 refills | Status: AC

## 2016-07-23 MED ORDER — PREDNISONE 50 MG PO TABS: ORAL_TABLET | ORAL | 0 refills | Status: AC

## 2016-07-23 MED ORDER — DIPHENHYDRAMINE HCL 25 MG PO TABS: 25.0000 mg | ORAL_TABLET | Freq: Four times a day (QID) | ORAL | 0 refills | Status: AC

## 2016-07-23 MED ORDER — EPINEPHRINE 0.3 MG/0.3ML IJ SOAJ: 0.3000 mg | Freq: Once | INTRAMUSCULAR | 0 refills | Status: AC

## 2016-07-23 NOTE — Discharge Instructions (Signed)
Stop taking nitrofurantoin. Take the prednisone as prescribed. Follow-up with her doctor. Return to the ED if you develop new or worsening symptoms.

## 2016-07-23 NOTE — ED Notes (Signed)
Pt tolerated fluid challenge well. 

## 2017-01-25 ENCOUNTER — Other Ambulatory Visit (HOSPITAL_COMMUNITY): Payer: Self-pay | Admitting: Internal Medicine

## 2017-01-25 DIAGNOSIS — Z1231 Encounter for screening mammogram for malignant neoplasm of breast: Secondary | ICD-10-CM

## 2017-05-01 ENCOUNTER — Ambulatory Visit (HOSPITAL_COMMUNITY): Payer: PRIVATE HEALTH INSURANCE

## 2017-05-03 ENCOUNTER — Ambulatory Visit (HOSPITAL_COMMUNITY)
Admission: RE | Admit: 2017-05-03 | Discharge: 2017-05-03 | Disposition: A | Discharge status: Home / Self Care | Payer: PRIVATE HEALTH INSURANCE | Source: Ambulatory Visit | Attending: Internal Medicine | Admitting: Internal Medicine

## 2017-05-03 DIAGNOSIS — Z1231 Encounter for screening mammogram for malignant neoplasm of breast: Secondary | ICD-10-CM | POA: Diagnosis not present

## 2018-06-18 ENCOUNTER — Other Ambulatory Visit (HOSPITAL_COMMUNITY): Payer: Self-pay | Admitting: Internal Medicine

## 2018-06-18 DIAGNOSIS — Z1231 Encounter for screening mammogram for malignant neoplasm of breast: Secondary | ICD-10-CM

## 2018-06-25 ENCOUNTER — Ambulatory Visit (HOSPITAL_COMMUNITY): Payer: PRIVATE HEALTH INSURANCE

## 2019-01-15 ENCOUNTER — Other Ambulatory Visit: Payer: Self-pay

## 2019-01-15 DIAGNOSIS — Z20822 Contact with and (suspected) exposure to covid-19: Secondary | ICD-10-CM

## 2019-01-17 LAB — NOVEL CORONAVIRUS, NAA: SARS-CoV-2, NAA: NOT DETECTED

## 2020-01-08 ENCOUNTER — Other Ambulatory Visit (HOSPITAL_COMMUNITY): Payer: Self-pay | Admitting: Internal Medicine

## 2020-01-08 DIAGNOSIS — Z1231 Encounter for screening mammogram for malignant neoplasm of breast: Secondary | ICD-10-CM

## 2020-06-10 ENCOUNTER — Other Ambulatory Visit: Payer: Self-pay | Admitting: Internal Medicine

## 2020-06-10 ENCOUNTER — Other Ambulatory Visit: Payer: Self-pay | Admitting: *Deleted

## 2020-06-10 DIAGNOSIS — Z1231 Encounter for screening mammogram for malignant neoplasm of breast: Secondary | ICD-10-CM

## 2020-06-23 ENCOUNTER — Ambulatory Visit
Admission: RE | Admit: 2020-06-23 | Discharge: 2020-06-23 | Disposition: A | Discharge status: Home / Self Care | Payer: PRIVATE HEALTH INSURANCE | Source: Ambulatory Visit | Attending: Internal Medicine | Admitting: Internal Medicine

## 2020-06-23 ENCOUNTER — Other Ambulatory Visit: Payer: Self-pay

## 2020-06-23 DIAGNOSIS — Z1231 Encounter for screening mammogram for malignant neoplasm of breast: Secondary | ICD-10-CM

## 2021-01-25 ENCOUNTER — Other Ambulatory Visit (HOSPITAL_COMMUNITY): Payer: Self-pay

## 2021-01-25 MED ORDER — INFLUENZA VAC SPLIT QUAD 0.5 ML IM SUSY
PREFILLED_SYRINGE | INTRAMUSCULAR | 0 refills | Status: AC
  Filled 2021-01-25: qty 0.5, 1d supply, fill #0

## 2021-02-15 ENCOUNTER — Ambulatory Visit
Admission: EM | Admit: 2021-02-15 | Discharge: 2021-02-15 | Disposition: A | Discharge status: Home / Self Care | Payer: No Typology Code available for payment source

## 2021-02-15 ENCOUNTER — Ambulatory Visit: Admit: 2021-02-15 | Discharge status: Absent without leave | Payer: PRIVATE HEALTH INSURANCE

## 2021-02-15 ENCOUNTER — Other Ambulatory Visit: Payer: Self-pay

## 2021-02-15 DIAGNOSIS — R0981 Nasal congestion: Secondary | ICD-10-CM

## 2021-02-15 DIAGNOSIS — J069 Acute upper respiratory infection, unspecified: Secondary | ICD-10-CM

## 2021-02-15 MED ORDER — CETIRIZINE HCL 10 MG PO TABS: 10.0000 mg | ORAL_TABLET | Freq: Every day | ORAL | 0 refills | Status: AC

## 2021-02-15 MED ORDER — BENZONATATE 100 MG PO CAPS: 100.0000 mg | ORAL_CAPSULE | Freq: Three times a day (TID) | ORAL | 0 refills | Status: AC | PRN

## 2021-02-15 MED ORDER — PSEUDOEPHEDRINE HCL 30 MG PO TABS: 30.0000 mg | ORAL_TABLET | Freq: Three times a day (TID) | ORAL | 0 refills | Status: AC | PRN

## 2021-02-15 MED ORDER — PROMETHAZINE-DM 6.25-15 MG/5ML PO SYRP: 5.0000 mL | ORAL_SOLUTION | Freq: Every evening | ORAL | 0 refills | Status: AC | PRN

## 2021-02-15 NOTE — ED Provider Notes (Signed)
[place]   MRN: [id number] DOB: [birth year]  Subjective:   Sherry Pitts is a 60 y.o. female presenting for 2-day history of acute onset sinus congestion, sinus pressure, coughing.  Patient has also developed hoarseness.  No chest pain, shortness of breath or wheezing, body aches, ear pain, fevers.  Has used Benadryl, Zicam.  No history of asthma, allergies.  She is not a smoker.  No current facility-administered medications for this encounter.  Current Outpatient Medications:    omeprazole (PRILOSEC) 20 MG capsule, Take 20 mg by mouth daily., Disp: , Rfl:    azithromycin (ZITHROMAX) 250 MG tablet, Take 250-500 mg by mouth daily. Take two tablets by mouth on day 1, then take one tablet on days 2 through 5, Disp: , Rfl:    ciprofloxacin (CIPRO) 500 MG tablet, Take 500 mg by mouth 2 (two) times daily., Disp: , Rfl:    diphenhydrAMINE (BENADRYL) 25 MG tablet, Take 1 tablet (25 mg total) by mouth every 6 (six) hours., Disp: 20 tablet, Rfl: 0   famotidine (PEPCID) 20 MG tablet, Take 1 tablet (20 mg total) by mouth 2 (two) times daily., Disp: 30 tablet, Rfl: 0   influenza vac split quadrivalent PF (FLUARIX) 0.5 ML injection, Inject into the muscle., Disp: 0.5 mL, Rfl: 0   miconazole (MONISTAT-3) KIT, Place 1 kit vaginally at bedtime., Disp: , Rfl:    Multiple Vitamin (MULTIVITAMIN) tablet, Take 1 tablet by mouth daily., Disp: , Rfl:    nitrofurantoin (MACRODANTIN) 100 MG capsule, Take 100 mg by mouth 2 (two) times daily., Disp: , Rfl:    predniSONE (DELTASONE) 50 MG tablet, 1 tablet PO daily, Disp: 5 tablet, Rfl: 0   Propylene Glycol-Glycerin 0.6-0.6 % SOLN, Apply 1 drop to eye daily as needed., Disp: , Rfl:    Allergies  Allergen Reactions   Ceclor [Cefaclor] Hives   Elemental Sulfur Hives   Penicillins Hives   Macrodantin [Nitrofurantoin]     Past Medical History:  Diagnosis Date   Headache(784.0)    migraines   Kidney stone      Past Surgical History:   Procedure Laterality Date   COLONOSCOPY  03/23/2012   Procedure: COLONOSCOPY;  Surgeon: Danie Binder, MD;  Location: [facility];  Service: Endoscopy;  Laterality: N/A;  12:30 -rescheduled to 11:15 AM Doris notified pt    Family History  Problem Relation Age of Onset   Heart failure Other    Hypertension Other     Social History   Tobacco Use   Smoking status: Never   Smokeless tobacco: Never  Substance Use Topics   Alcohol use: No   Drug use: No    ROS   Objective:   Vitals: BP 136/89   Pulse 90   Temp 98.8 F (37.1 C) (Oral)   Resp 16   SpO2 96%   Physical Exam Constitutional:      General: She is not in acute distress.    Appearance: Normal appearance. She is well-developed. She is not ill-appearing, toxic-appearing or diaphoretic.  HENT:     Head: Normocephalic and atraumatic.     Right Ear: Tympanic membrane and ear canal normal. No drainage or tenderness. No middle ear effusion. Tympanic membrane is not erythematous.     Left Ear: Tympanic membrane and ear canal normal. No drainage or tenderness.  No middle ear effusion. Tympanic membrane is not erythematous.     Nose: Congestion present. No rhinorrhea.     Mouth/Throat:  Mouth: Mucous membranes are moist. No oral lesions.     Pharynx: No pharyngeal swelling, oropharyngeal exudate, posterior oropharyngeal erythema or uvula swelling.     Tonsils: No tonsillar exudate or tonsillar abscesses.  Eyes:     General: No scleral icterus.       Right eye: No discharge.        Left eye: No discharge.     Extraocular Movements: Extraocular movements intact.     Right eye: Normal extraocular motion.     Left eye: Normal extraocular motion.     Conjunctiva/sclera: Conjunctivae normal.     Pupils: Pupils are equal, round, and reactive to light.  Cardiovascular:     Rate and Rhythm: Normal rate and regular rhythm.     Pulses: Normal pulses.     Heart sounds: Normal heart sounds. No murmur heard.   No  friction rub. No gallop.  Pulmonary:     Effort: Pulmonary effort is normal. No respiratory distress.     Breath sounds: Normal breath sounds. No stridor. No wheezing, rhonchi or rales.  Musculoskeletal:     Cervical back: Normal range of motion and neck supple.  Lymphadenopathy:     Cervical: No cervical adenopathy.  Skin:    General: Skin is warm and dry.     Findings: No rash.  Neurological:     General: No focal deficit present.     Mental Status: She is alert and oriented to person, place, and time.  Psychiatric:        Mood and Affect: Mood normal.        Behavior: Behavior normal.        Thought Content: Thought content normal.    Assessment and Plan :   PDMP not reviewed this encounter.  1. Viral URI with cough   2. Sinus congestion    Deferred imaging given clear cardiopulmonary exam, hemodynamically stable vital signs. COVID and flu test pending.  We will otherwise manage for viral upper respiratory infection.  Physical exam findings reassuring and vital signs stable for discharge. Advised supportive care, offered symptomatic relief. Counseled patient on potential for adverse effects with medications prescribed/recommended today, ER and return-to-clinic precautions discussed, patient verbalized understanding.       Jaynee Eagles, PA-C 02/15/21 1402

## 2021-02-15 NOTE — Discharge Instructions (Addendum)
We will notify you of your test results as they arrive and may take between 48-72 hours.  I encourage you to sign up for MyChart if you have not already done so as this can be the easiest way for [place] to communicate results to you online or through a phone app.  Generally, we only contact you if it is a positive test result.  In the meantime, if you develop worsening symptoms including fever, chest pain, shortness of breath despite our current treatment plan then please report to the emergency room as this may be a sign of worsening status from possible viral infection.  Otherwise, we will manage this as a viral syndrome. For sore throat or cough try using a honey-based tea. Use 3 teaspoons of honey with juice squeezed from half lemon. Place shaved pieces of ginger into 1/2-1 cup of water and warm over stove top. Then mix the ingredients and repeat every 4 hours as needed. Please take Tylenol 500mg -650mg  every 6 hours for aches and pains, fevers. Hydrate very well with at least 2 liters of water. Eat light meals such as soups to replenish electrolytes and soft fruits, veggies. Start an antihistamine like Zyrtec for postnasal drainage, sinus congestion.  You can take this together with pseudoephedrine (Sudafed) at a dose of 30 mg 2-3 times a day as needed for the same kind of congestion.

## 2021-02-15 NOTE — ED Triage Notes (Signed)
Congestion and facial pain for 2 days, believes she has a sinus infection

## 2021-02-16 LAB — COVID-19, FLU A+B NAA
Influenza A, NAA: NOT DETECTED
Influenza B, NAA: NOT DETECTED
SARS-CoV-2, NAA: NOT DETECTED

## 2022-02-25 ENCOUNTER — Encounter: Payer: Self-pay | Admitting: *Deleted

## 2022-04-21 ENCOUNTER — Other Ambulatory Visit (HOSPITAL_COMMUNITY): Payer: Self-pay | Admitting: Internal Medicine

## 2022-04-21 DIAGNOSIS — E2839 Other primary ovarian failure: Secondary | ICD-10-CM

## 2022-04-21 DIAGNOSIS — Z1231 Encounter for screening mammogram for malignant neoplasm of breast: Secondary | ICD-10-CM

## 2022-04-25 ENCOUNTER — Ambulatory Visit (HOSPITAL_COMMUNITY): Payer: No Typology Code available for payment source

## 2022-04-29 ENCOUNTER — Encounter: Payer: Self-pay | Admitting: *Deleted

## 2022-05-09 ENCOUNTER — Ambulatory Visit (HOSPITAL_COMMUNITY)
Admission: RE | Admit: 2022-05-09 | Discharge: 2022-05-09 | Disposition: A | Discharge status: Home / Self Care | Payer: PRIVATE HEALTH INSURANCE | Source: Ambulatory Visit | Attending: Internal Medicine | Admitting: Internal Medicine

## 2022-05-09 DIAGNOSIS — Z1231 Encounter for screening mammogram for malignant neoplasm of breast: Secondary | ICD-10-CM | POA: Diagnosis not present

## 2022-05-09 DIAGNOSIS — E2839 Other primary ovarian failure: Secondary | ICD-10-CM

## 2022-05-11 ENCOUNTER — Other Ambulatory Visit (HOSPITAL_COMMUNITY): Payer: Self-pay | Admitting: Internal Medicine

## 2022-05-11 DIAGNOSIS — R928 Other abnormal and inconclusive findings on diagnostic imaging of breast: Secondary | ICD-10-CM

## 2022-05-24 ENCOUNTER — Ambulatory Visit (HOSPITAL_COMMUNITY)
Admission: RE | Admit: 2022-05-24 | Discharge: 2022-05-24 | Disposition: A | Discharge status: Home / Self Care | Payer: PRIVATE HEALTH INSURANCE | Source: Ambulatory Visit | Attending: Internal Medicine | Admitting: Internal Medicine

## 2022-05-24 ENCOUNTER — Encounter (HOSPITAL_COMMUNITY): Payer: Self-pay

## 2022-05-24 DIAGNOSIS — R928 Other abnormal and inconclusive findings on diagnostic imaging of breast: Secondary | ICD-10-CM

## 2022-07-29 IMAGING — MG MM DIGITAL SCREENING BILAT W/ TOMO AND CAD
8 series · 8 of 24 positions shown · non-contrast
Comparison: Previous exam(s).

CLINICAL DATA: Screening.

EXAM:
DIGITAL SCREENING BILATERAL MAMMOGRAM WITH TOMOSYNTHESIS AND CAD
TECHNIQUE: Bilateral screening digital craniocaudal and mediolateral oblique
mammograms were obtained. Bilateral screening digital breast
tomosynthesis was performed. The images were evaluated with
computer-aided detection.

[L MLO synth-2D]
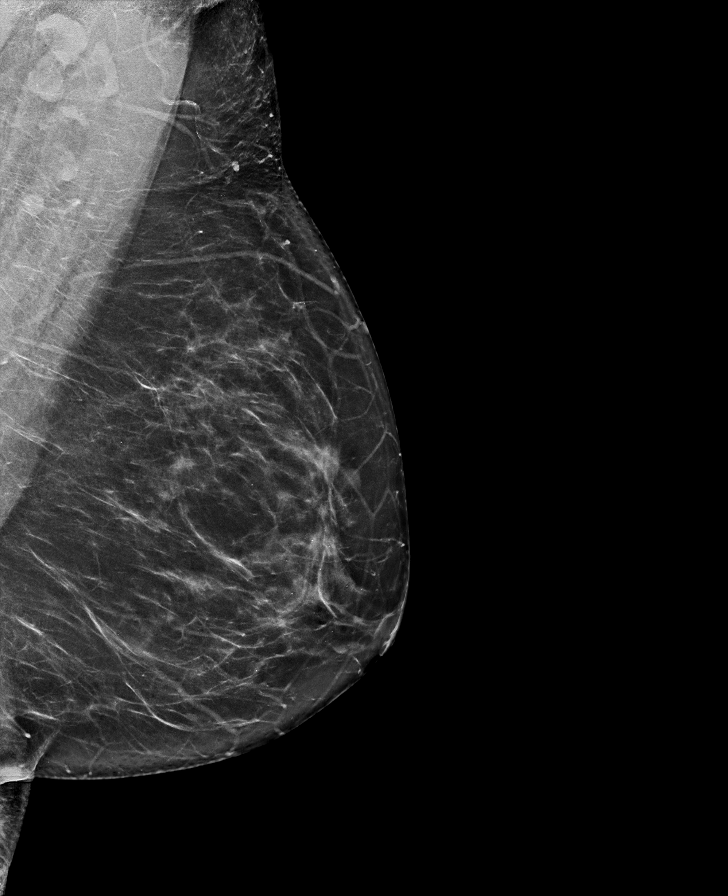

[L CC synth-2D]
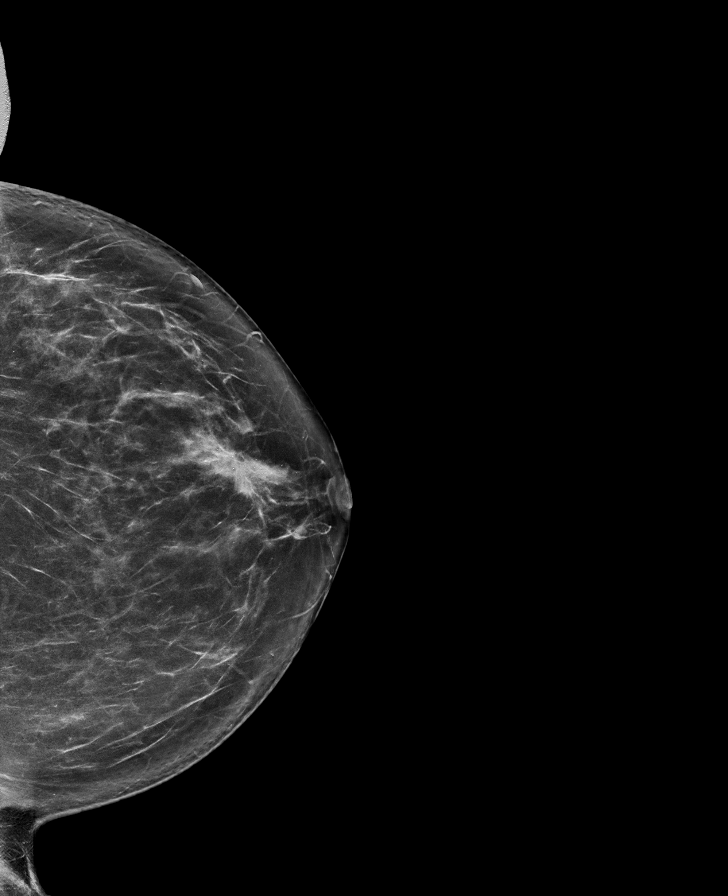

[R CC synth-2D]
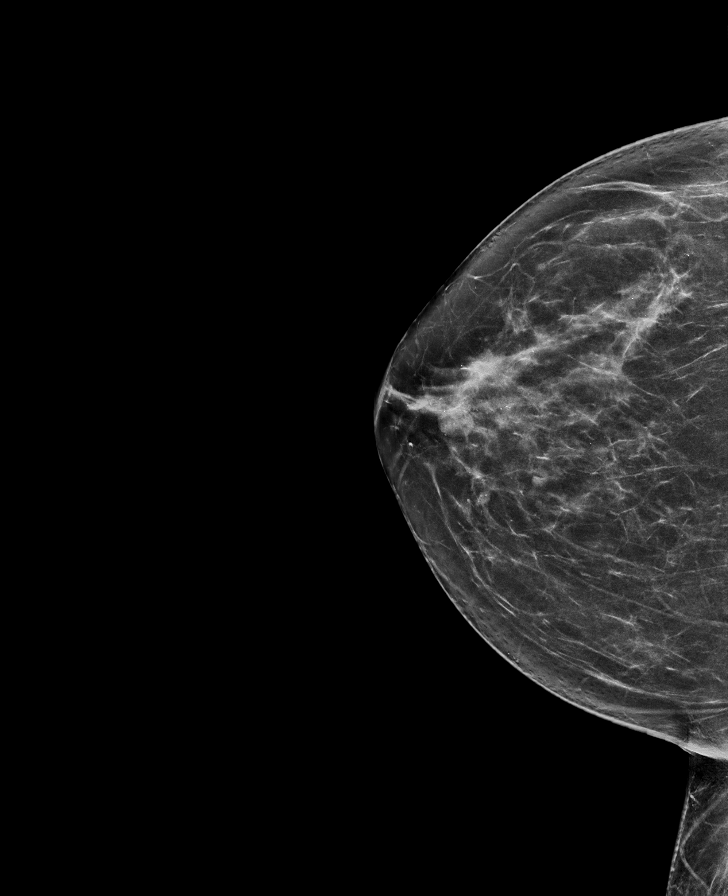

[R MLO synth-2D]
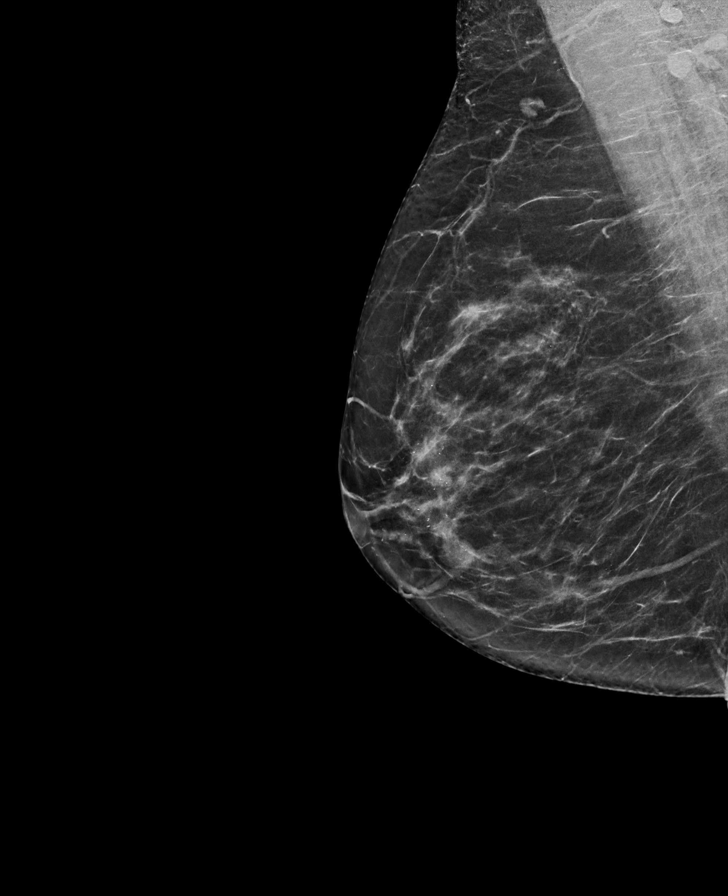

[L CC tomo · tomo slice 39/76.0]
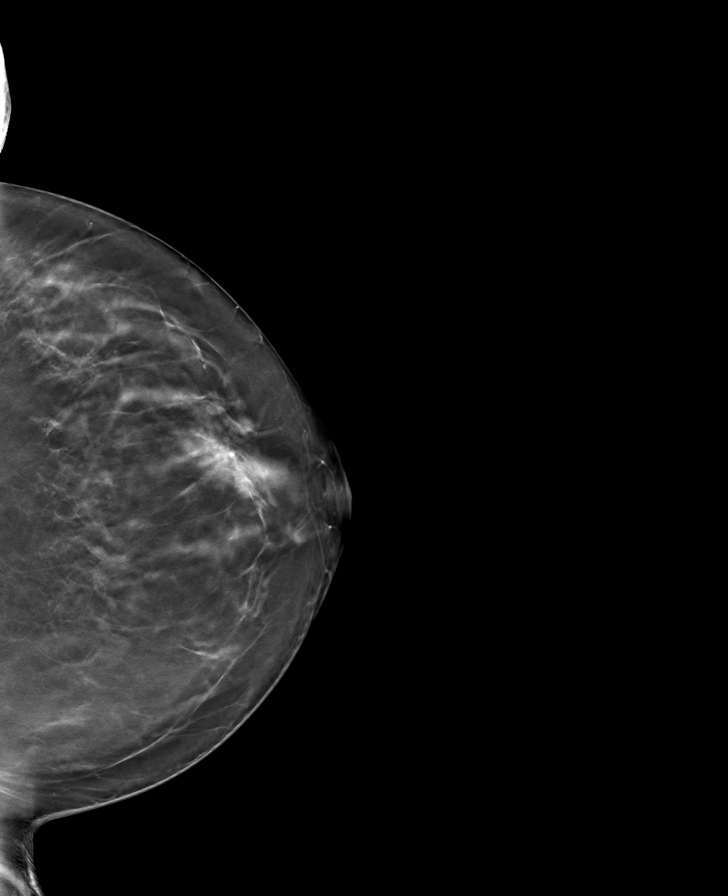

[R MLO tomo · tomo slice 35/68.0]
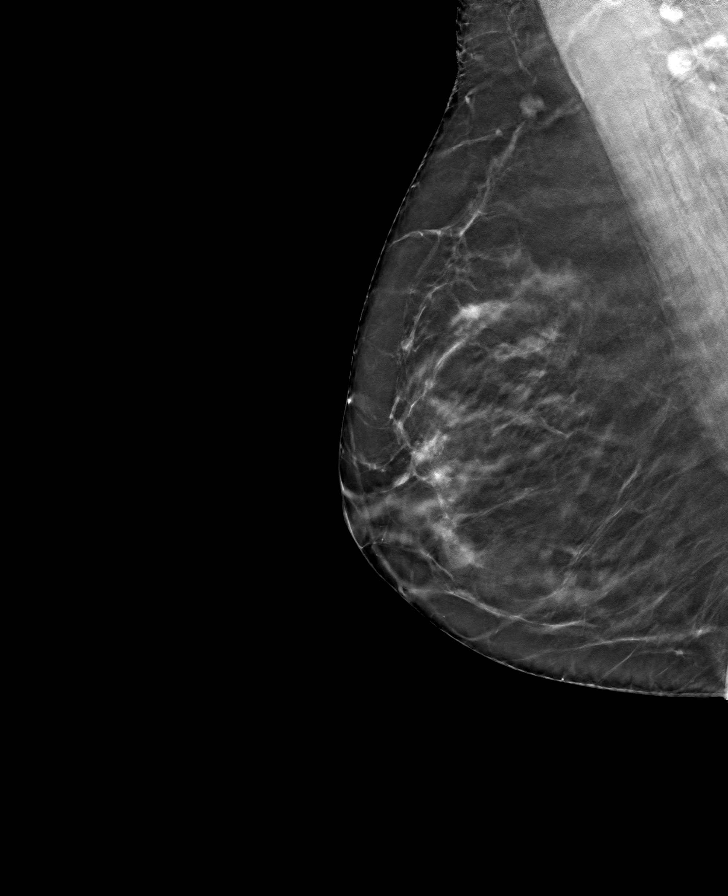

[L MLO tomo · tomo slice 39/76.0]
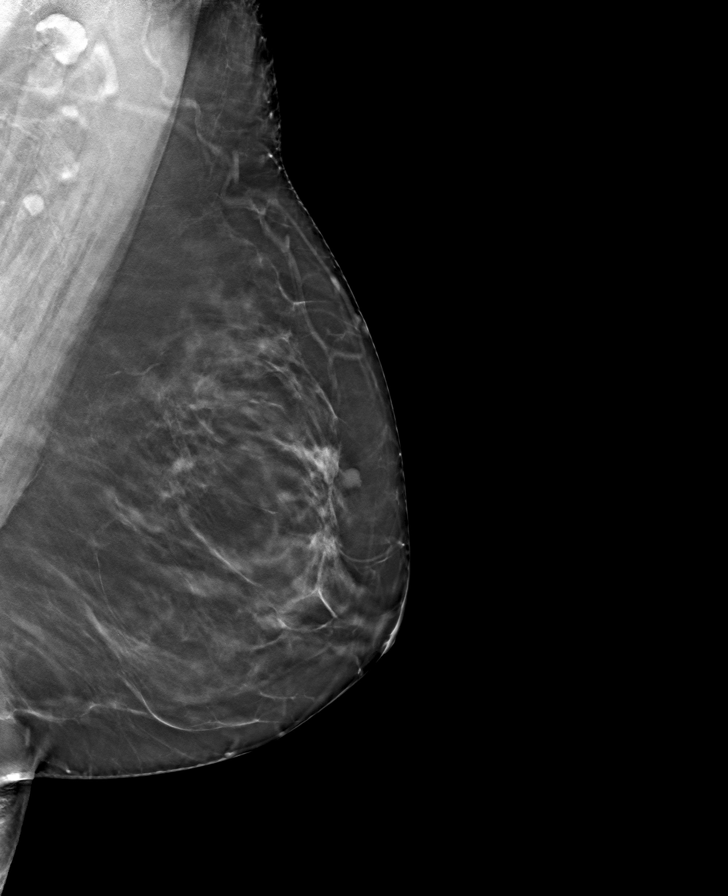

[R CC tomo · tomo slice 35/69.0]
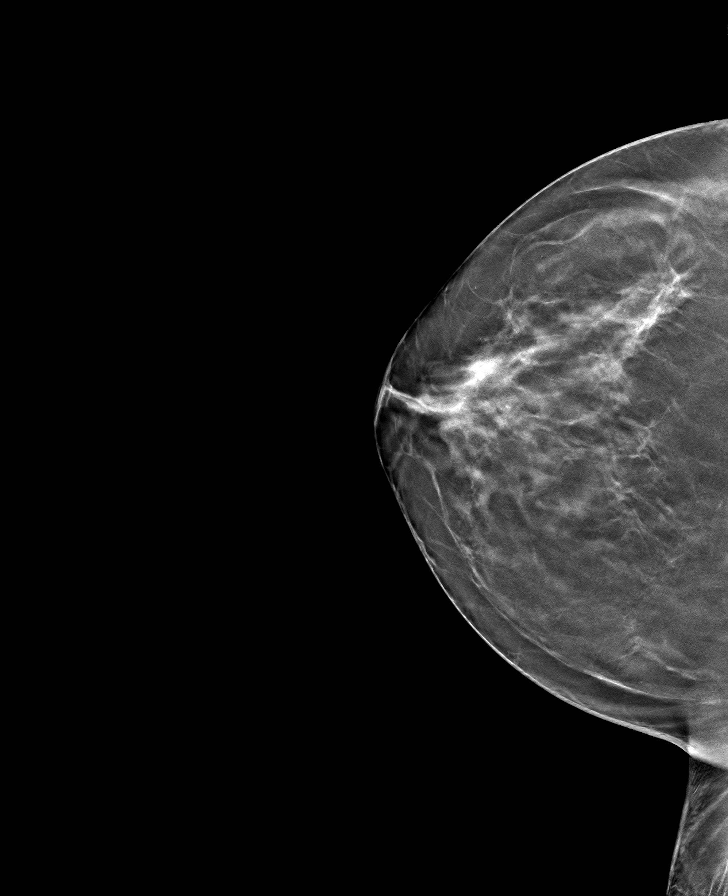

[8 of 24 positions shown; findings below may reference images not displayed]

ACR Breast Density Category b: There are scattered areas of
fibroglandular density.
FINDINGS: There are no findings suspicious for malignancy. The images were
evaluated with computer-aided detection.
IMPRESSION: No mammographic evidence of malignancy. A result letter of this
screening mammogram will be mailed directly to the patient.

RECOMMENDATION:
Screening mammogram in one year. (Code:WJ-I-BG6)

BI-RADS CATEGORY  1: Negative.

## 2022-10-11 ENCOUNTER — Encounter: Payer: Self-pay | Admitting: *Deleted

## 2023-10-11 DIAGNOSIS — R928 Other abnormal and inconclusive findings on diagnostic imaging of breast: Secondary | ICD-10-CM | POA: Diagnosis not present

## 2023-10-11 DIAGNOSIS — Z6829 Body mass index (BMI) 29.0-29.9, adult: Secondary | ICD-10-CM | POA: Diagnosis not present

## 2023-10-11 DIAGNOSIS — K219 Gastro-esophageal reflux disease without esophagitis: Secondary | ICD-10-CM | POA: Diagnosis not present

## 2023-10-11 DIAGNOSIS — E6609 Other obesity due to excess calories: Secondary | ICD-10-CM | POA: Diagnosis not present

## 2023-10-12 ENCOUNTER — Other Ambulatory Visit (HOSPITAL_COMMUNITY): Payer: Self-pay | Admitting: Family Medicine

## 2023-10-12 DIAGNOSIS — Z1231 Encounter for screening mammogram for malignant neoplasm of breast: Secondary | ICD-10-CM

## 2023-10-16 ENCOUNTER — Encounter (INDEPENDENT_AMBULATORY_CARE_PROVIDER_SITE_OTHER): Payer: Self-pay | Admitting: *Deleted

## 2023-11-10 ENCOUNTER — Emergency Department (HOSPITAL_COMMUNITY)

## 2023-11-10 ENCOUNTER — Encounter (HOSPITAL_COMMUNITY): Payer: Self-pay

## 2023-11-10 ENCOUNTER — Emergency Department (HOSPITAL_COMMUNITY)
Admission: EM | Admit: 2023-11-10 | Discharge: 2023-11-11 | Disposition: A | Discharge status: Home / Self Care | Attending: Emergency Medicine | Admitting: Emergency Medicine

## 2023-11-10 DIAGNOSIS — R102 Pelvic and perineal pain: Secondary | ICD-10-CM | POA: Diagnosis not present

## 2023-11-10 DIAGNOSIS — R1030 Lower abdominal pain, unspecified: Secondary | ICD-10-CM | POA: Insufficient documentation

## 2023-11-10 DIAGNOSIS — N200 Calculus of kidney: Secondary | ICD-10-CM | POA: Diagnosis not present

## 2023-11-10 DIAGNOSIS — K449 Diaphragmatic hernia without obstruction or gangrene: Secondary | ICD-10-CM | POA: Diagnosis not present

## 2023-11-10 DIAGNOSIS — K8020 Calculus of gallbladder without cholecystitis without obstruction: Secondary | ICD-10-CM | POA: Diagnosis not present

## 2023-11-10 DIAGNOSIS — N281 Cyst of kidney, acquired: Secondary | ICD-10-CM | POA: Diagnosis not present

## 2023-11-10 LAB — CBC WITH DIFFERENTIAL/PLATELET
Abs Immature Granulocytes: 0.03 K/uL (ref 0.00–0.07)
Basophils Absolute: 0 K/uL (ref 0.0–0.1)
Basophils Relative: 0 %
Eosinophils Absolute: 0.1 K/uL (ref 0.0–0.5)
Eosinophils Relative: 1 %
HCT: 39.3 % (ref 36.0–46.0)
Hemoglobin: 12.7 g/dL (ref 12.0–15.0)
Immature Granulocytes: 0 %
Lymphocytes Relative: 20 %
Lymphs Abs: 1.6 K/uL (ref 0.7–4.0)
MCH: 28.2 pg (ref 26.0–34.0)
MCHC: 32.3 g/dL (ref 30.0–36.0)
MCV: 87.3 fL (ref 80.0–100.0)
Monocytes Absolute: 0.5 K/uL (ref 0.1–1.0)
Monocytes Relative: 7 %
Neutro Abs: 5.8 K/uL (ref 1.7–7.7)
Neutrophils Relative %: 72 %
Platelets: 311 K/uL (ref 150–400)
RBC: 4.5 MIL/uL (ref 3.87–5.11)
RDW: 13.6 % (ref 11.5–15.5)
WBC: 8 K/uL (ref 4.0–10.5)
nRBC: 0 % (ref 0.0–0.2)

## 2023-11-10 LAB — URINALYSIS, W/ REFLEX TO CULTURE (INFECTION SUSPECTED)
Bacteria, UA: NONE SEEN
Bilirubin Urine: NEGATIVE
Glucose, UA: NEGATIVE mg/dL
Hgb urine dipstick: NEGATIVE
Ketones, ur: NEGATIVE mg/dL
Nitrite: NEGATIVE
Protein, ur: NEGATIVE mg/dL
Specific Gravity, Urine: 1.023 (ref 1.005–1.030)
pH: 6 (ref 5.0–8.0)

## 2023-11-10 LAB — BASIC METABOLIC PANEL WITH GFR
Anion gap: 12 (ref 5–15)
BUN: 11 mg/dL (ref 8–23)
CO2: 22 mmol/L (ref 22–32)
Calcium: 9.1 mg/dL (ref 8.9–10.3)
Chloride: 106 mmol/L (ref 98–111)
Creatinine, Ser: 0.75 mg/dL (ref 0.44–1.00)
GFR, Estimated: >60 mL/min (ref >60)
Glucose, Bld: 129 mg/dL — ABNORMAL HIGH (ref 70–99)
Potassium: 3.4 mmol/L — ABNORMAL LOW (ref 3.5–5.1)
Sodium: 140 mmol/L (ref 135–145)

## 2023-11-10 MED ORDER — FOSFOMYCIN TROMETHAMINE 3 G PO PACK
3.0000 g | PACK | Freq: Once | ORAL | Status: AC
  Administered 2023-11-11: 3 g via ORAL
  Filled 2023-11-10: qty 3

## 2023-11-10 MED ORDER — KETOROLAC TROMETHAMINE 15 MG/ML IJ SOLN
15.0000 mg | Freq: Once | INTRAMUSCULAR | Status: AC
  Administered 2023-11-11: 15 mg via INTRAVENOUS
  Filled 2023-11-10: qty 1

## 2023-11-10 MED ORDER — IOHEXOL 300 MG/ML  SOLN
100.0000 mL | Freq: Once | INTRAMUSCULAR | Status: AC | PRN
  Administered 2023-11-10: 100 mL via INTRAVENOUS

## 2023-11-10 NOTE — ED Notes (Signed)
 The patient is ambulatory to and from restroom with independent steady gait.

## 2023-11-10 NOTE — ED Triage Notes (Signed)
 Pt comes in for a possible UTI. Pt's lower abd. Pain started this evening and has gotten worse. Pt states the pain is similar to when she has a UTI. Pt does have some nausea. Pt denies any pain or burning when she pees. A&Ox4. Pt walked to triage from waiting without any issues. Pt does have hx of kidney stones.

## 2023-11-10 NOTE — ED Provider Notes (Signed)
 [place] EMERGENCY DEPARTMENT AT [facility] Provider Note   CSN: [id number] Arrival date & time: 11/10/23  2131     Patient presents with: Abdominal Pain   Sherry Pitts is a 63 y.o. female.  She is here with some lower abdominal discomfort and some pressure.  No dysuria.  Feels similar to when she has had a UTI in the past.  Also has a history of kidney stones.  Feels a little nauseous.  No fever.  No back pain.  No constipation or diarrhea.  Has tried nothing for her symptoms.   The history is provided by the patient.  Abdominal Pain Pain location:  Suprapubic Pain quality: pressure   Pain radiates to:  Does not radiate Timing:  Constant Progression:  Worsening Chronicity:  New Relieved by:  None tried Associated symptoms: nausea   Associated symptoms: no constipation, no cough, no diarrhea, no dysuria, no fever, no hematuria and no vomiting        Prior to Admission medications   Medication Sig Start Date End Date Taking? Authorizing Provider  benzonatate  (TESSALON ) 100 MG capsule Take 1-2 capsules (100-200 mg total) by mouth 3 (three) times daily as needed for cough. 02/15/21   Christopher Savannah, PA-C  cetirizine  (ZYRTEC  ALLERGY) 10 MG tablet Take 1 tablet (10 mg total) by mouth daily. 02/15/21   Christopher Savannah, PA-C  ciprofloxacin (CIPRO) 500 MG tablet Take 500 mg by mouth 2 (two) times daily.    [provider]  diphenhydrAMINE  (BENADRYL ) 25 MG tablet Take 1 tablet (25 mg total) by mouth every 6 (six) hours. 07/23/16   Rancour, Garnette, MD  famotidine  (PEPCID ) 20 MG tablet Take 1 tablet (20 mg total) by mouth 2 (two) times daily. 07/23/16   Rancour, Garnette, MD  influenza vac split quadrivalent PF (FLUARIX) 0.5 ML injection Inject into the muscle. 01/25/21   Luiz Channel, MD  miconazole (MONISTAT-3) KIT Place 1 kit vaginally at bedtime.    [provider]  Multiple Vitamin (MULTIVITAMIN) tablet Take 1 tablet by mouth daily.    [provider]  nitrofurantoin (MACRODANTIN) 100 MG capsule Take 100 mg by mouth 2 (two) times daily.    [provider]  omeprazole (PRILOSEC) 20 MG capsule Take 20 mg by mouth daily.    [provider]  predniSONE  (DELTASONE ) 50 MG tablet 1 tablet PO daily 07/23/16   Rancour, Garnette, MD  promethazine -dextromethorphan (PROMETHAZINE -DM) 6.25-15 MG/5ML syrup Take 5 mLs by mouth at bedtime as needed for cough. 02/15/21   Christopher Savannah, PA-C  Propylene Glycol-Glycerin 0.6-0.6 % SOLN Apply 1 drop to eye daily as needed.    [provider]  pseudoephedrine  (SUDAFED) 30 MG tablet Take 1 tablet (30 mg total) by mouth every 8 (eight) hours as needed for congestion. 02/15/21   Christopher Savannah, PA-C    Allergies: Ceclor [cefaclor], Elemental sulfur, Penicillins, and Macrodantin [nitrofurantoin]    Review of Systems  Constitutional:  Negative for fever.  Respiratory:  Negative for cough.   Gastrointestinal:  Positive for abdominal pain and nausea. Negative for constipation, diarrhea and vomiting.  Genitourinary:  Negative for dysuria and hematuria.    Updated Vital Signs BP (!) 141/86 (BP Location: Right Arm)   Pulse 81   Temp 98.5 F (36.9 C) (Oral)   Resp 18   Ht 5' 3 (1.6 m)   Wt 77.1 kg   SpO2 100%   BMI 30.11 kg/m   Physical Exam Vitals and nursing note reviewed.  Constitutional:  General: She is not in acute distress.    Appearance: Normal appearance. She is well-developed.  HENT:     Head: Normocephalic and atraumatic.  Eyes:     Conjunctiva/sclera: Conjunctivae normal.  Cardiovascular:     Rate and Rhythm: Normal rate and regular rhythm.     Heart sounds: No murmur heard. Pulmonary:     Effort: Pulmonary effort is normal. No respiratory distress.     Breath sounds: Normal breath sounds. No stridor. No wheezing.  Abdominal:     Palpations: Abdomen is soft.     Tenderness: There is no abdominal tenderness. There is no guarding or rebound.  Musculoskeletal:         General: No deformity.     Cervical back: Neck supple.  Skin:    General: Skin is warm and dry.  Neurological:     General: No focal deficit present.     Mental Status: She is alert.     GCS: GCS eye subscore is 4. GCS verbal subscore is 5. GCS motor subscore is 6.     (all labs ordered are listed, but only abnormal results are displayed) Labs Reviewed  URINALYSIS, W/ REFLEX TO CULTURE (INFECTION SUSPECTED) - Abnormal; Notable for the following components:      Result Value   Leukocytes,Ua SMALL (*)    All other components within normal limits  BASIC METABOLIC PANEL WITH GFR - Abnormal; Notable for the following components:   Potassium 3.4 (*)    Glucose, Bld 129 (*)    All other components within normal limits  URINE CULTURE  CBC WITH DIFFERENTIAL/PLATELET    EKG: None  Radiology: CT ABDOMEN PELVIS W CONTRAST Result Date: 11/10/2023 CLINICAL DATA:  Lower abdominal pain. EXAM: CT ABDOMEN AND PELVIS WITH CONTRAST TECHNIQUE: Multidetector CT imaging of the abdomen and pelvis was performed using the standard protocol following bolus administration of intravenous contrast. RADIATION DOSE REDUCTION: This exam was performed according to the departmental dose-optimization program which includes automated exposure control, adjustment of the mA and/or kV according to patient size and/or use of iterative reconstruction technique. CONTRAST:  OMNIPAQUE  IOHEXOL  300 MG/ML  SOLN COMPARISON:  September 13, 2012 FINDINGS: Lower chest: A 5 mm pulmonary nodule is seen within the posterior aspect of the left upper lobe (axial CT image 4, CT series 3). Hepatobiliary: No focal liver abnormality is seen. A 2.5 cm gallstone is seen within the lumen of an otherwise normal-appearing gallbladder. There is no evidence of biliary dilatation. Pancreas: Unremarkable. No pancreatic ductal dilatation or surrounding inflammatory changes. Spleen: Normal in size without focal abnormality. Adrenals/Urinary Tract: Adrenal  glands are unremarkable. Kidneys are normal in size, without obstructing renal calculi or hydronephrosis. Subcentimeter simple cysts are present within the right kidney. Two adjacent 3 mm nonobstructing renal calculi are noted within the lower pole of the left kidney. The urinary bladder is poorly distended and subsequently limited in evaluation. Stomach/Bowel: There is a small hiatal hernia. Appendix appears normal. No evidence of bowel wall thickening, distention, or inflammatory changes. Vascular/Lymphatic: Aortic atherosclerosis. No enlarged abdominal or pelvic lymph nodes. Reproductive: Uterus and bilateral adnexa are unremarkable. Other: No abdominal wall hernia or abnormality. No abdominopelvic ascites. Musculoskeletal: No acute or significant osseous findings. IMPRESSION: 1. Cholelithiasis. 2. Small hiatal hernia. 3. Subcentimeter nonobstructing left renal calculi. 4. 5 mm left upper lobe pulmonary nodule further evaluation with dedicated nonemergent chest CT is recommended. 5. Aortic atherosclerosis. Electronically Signed   By: Suzen Dials M.D.   On: 11/10/2023 23:36  Procedures   Medications Ordered in the ED - No data to display                                  Medical Decision Making Amount and/or Complexity of Data Reviewed Labs: ordered. Radiology: ordered.  Risk Prescription drug management.   This patient complains of low abdominal pressure; this involves an extensive number of treatment Options and is a complaint that carries with it a high risk of complications and morbidity. The differential includes UTI, constipation, diverticulitis, colitis, appendicitis  I ordered, reviewed and interpreted labs, which included CBC with normal white count chemistries fairly unremarkable, urinalysis without clear signs of infection Previous records obtained and reviewed in epic no recent admissions Social determinants considered, no significant barriers Critical Interventions:  None  After the interventions stated above, I reevaluated the patient and found patient to be well-appearing in no distress Admission and further testing considered, she would need a CAT scan for further evaluation as her urine does not appear overwhelmingly infected to account for her discomfort.  Her care is signed out to Dr. Albertina to follow-up on results of CT and appropriately disposition.      Final diagnoses:  Suprapubic pain    ED Discharge Orders     None          Towana Ozell BROCKS, MD 11/11/23 1046

## 2023-11-12 LAB — URINE CULTURE: Culture: NO GROWTH

## 2023-11-13 ENCOUNTER — Ambulatory Visit (HOSPITAL_COMMUNITY): Payer: PRIVATE HEALTH INSURANCE

## 2024-04-26 ENCOUNTER — Emergency Department (HOSPITAL_COMMUNITY)
Admission: EM | Admit: 2024-04-26 | Discharge: 2024-04-26 | Disposition: A | Discharge status: Home / Self Care | Attending: Emergency Medicine | Admitting: Emergency Medicine

## 2024-04-26 ENCOUNTER — Encounter (HOSPITAL_COMMUNITY): Payer: Self-pay

## 2024-04-26 ENCOUNTER — Other Ambulatory Visit: Payer: Self-pay

## 2024-04-26 ENCOUNTER — Emergency Department (HOSPITAL_COMMUNITY)

## 2024-04-26 DIAGNOSIS — S80212A Abrasion, left knee, initial encounter: Secondary | ICD-10-CM | POA: Insufficient documentation

## 2024-04-26 DIAGNOSIS — Y990 Civilian activity done for income or pay: Secondary | ICD-10-CM | POA: Diagnosis not present

## 2024-04-26 DIAGNOSIS — W01198A Fall on same level from slipping, tripping and stumbling with subsequent striking against other object, initial encounter: Secondary | ICD-10-CM | POA: Insufficient documentation

## 2024-04-26 DIAGNOSIS — Y9289 Other specified places as the place of occurrence of the external cause: Secondary | ICD-10-CM | POA: Diagnosis not present

## 2024-04-26 DIAGNOSIS — R55 Syncope and collapse: Secondary | ICD-10-CM | POA: Insufficient documentation

## 2024-04-26 DIAGNOSIS — I493 Ventricular premature depolarization: Secondary | ICD-10-CM | POA: Insufficient documentation

## 2024-04-26 LAB — BASIC METABOLIC PANEL WITH GFR
Anion gap: 14 (ref 5–15)
BUN: 15 mg/dL (ref 8–23)
CO2: 24 mmol/L (ref 22–32)
Calcium: 9.7 mg/dL (ref 8.9–10.3)
Chloride: 104 mmol/L (ref 98–111)
Creatinine, Ser: 0.89 mg/dL (ref 0.44–1.00)
GFR, Estimated: >60 mL/min
Glucose, Bld: 94 mg/dL (ref 70–99)
Potassium: 3.7 mmol/L (ref 3.5–5.1)
Sodium: 141 mmol/L (ref 135–145)

## 2024-04-26 LAB — CBC WITH DIFFERENTIAL/PLATELET
Abs Immature Granulocytes: 0.04 K/uL (ref 0.00–0.07)
Basophils Absolute: 0 K/uL (ref 0.0–0.1)
Basophils Relative: 0 %
Eosinophils Absolute: 0.1 K/uL (ref 0.0–0.5)
Eosinophils Relative: 1 %
HCT: 40.1 % (ref 36.0–46.0)
Hemoglobin: 12.7 g/dL (ref 12.0–15.0)
Immature Granulocytes: 1 %
Lymphocytes Relative: 23 %
Lymphs Abs: 2 K/uL (ref 0.7–4.0)
MCH: 27.9 pg (ref 26.0–34.0)
MCHC: 31.7 g/dL (ref 30.0–36.0)
MCV: 87.9 fL (ref 80.0–100.0)
Monocytes Absolute: 0.5 K/uL (ref 0.1–1.0)
Monocytes Relative: 5 %
Neutro Abs: 6 K/uL (ref 1.7–7.7)
Neutrophils Relative %: 70 %
Platelets: 302 K/uL (ref 150–400)
RBC: 4.56 MIL/uL (ref 3.87–5.11)
RDW: 13.7 % (ref 11.5–15.5)
WBC: 8.6 K/uL (ref 4.0–10.5)
nRBC: 0 % (ref 0.0–0.2)

## 2024-04-26 LAB — MAGNESIUM: Magnesium: 2.1 mg/dL (ref 1.7–2.4)

## 2024-04-26 MED ORDER — TRIPLE ANTIBIOTIC 3.5-400-5000 EX OINT
TOPICAL_OINTMENT | Freq: Once | CUTANEOUS | Status: AC
  Administered 2024-04-26: 1 via CUTANEOUS
  Filled 2024-04-26: qty 1

## 2024-04-26 NOTE — Discharge Instructions (Signed)
 Your lab test and exam today and your time spent on the cardiac monitor are reassuring.  You do have an occasional PVC which can be a normal finding as many people have these and never have symptoms, however you are being referred to a cardiologist for further evaluation given your episode of passing out today.  Rest and make sure you are drinking plenty of fluids.  Return here in the interim for any new or worsened symptoms.

## 2024-04-26 NOTE — ED Triage Notes (Signed)
 Pt was working at [employer] when she tripped and fell hitting left knee. Pt did pass out for 20 seconds as per nurse nearby that assisted. Denies cardiac hx.

## 2024-04-26 NOTE — ED Provider Notes (Signed)
 " [facility] Provider Note   CSN: [id number] Arrival date & time: 04/26/24  1615     Patient presents with: Sherry Pitts is a 64 y.o. female with no send past medical history was working at a [employer], caring food trays to peoples cars when she tripped and fell striking her left knee on the pavement.  She sustained an abrasion of her knee and endorses initially having severe pain in the knee which has since resolved, however when she stood up and as others were helping her walk back she had a 20 second syncopal episode, was caught by her friends and did not fall a second time.  She reports remembering severe pain in the knee, did not feel lightheaded, also denies chest pain, shortness of breath or palp potation's prior to passing out.  However when EMS arrived and put her on the heart monitor, she was found to have frequent PVCs.  She is currently without any complaint of pain except for soreness at the site of her cyst skin abrasion left knee, she is weightbearing on this knee without complaint of increased pain.  Her tetanus is current.  Patient denies reasons for dehydration, although her last meal today was breakfast around 930.  She had had no p.o. intake since.    The history is provided by the patient and the spouse.       Prior to Admission medications  Medication Sig Start Date End Date Taking? Authorizing Provider  benzonatate  (TESSALON ) 100 MG capsule Take 1-2 capsules (100-200 mg total) by mouth 3 (three) times daily as needed for cough. 02/15/21   Christopher Savannah, PA-C  cetirizine  (ZYRTEC  ALLERGY) 10 MG tablet Take 1 tablet (10 mg total) by mouth daily. 02/15/21   Christopher Savannah, PA-C  ciprofloxacin (CIPRO) 500 MG tablet Take 500 mg by mouth 2 (two) times daily.    [provider]  diphenhydrAMINE  (BENADRYL ) 25 MG tablet Take 1 tablet (25 mg total) by mouth every 6 (six) hours. 07/23/16   Rancour, Garnette, MD   famotidine  (PEPCID ) 20 MG tablet Take 1 tablet (20 mg total) by mouth 2 (two) times daily. 07/23/16   Rancour, Garnette, MD  influenza vac split quadrivalent PF (FLUARIX) 0.5 ML injection Inject into the muscle. 01/25/21   Luiz Channel, MD  miconazole (MONISTAT-3) KIT Place 1 kit vaginally at bedtime.    [provider]  Multiple Vitamin (MULTIVITAMIN) tablet Take 1 tablet by mouth daily.    [provider]  nitrofurantoin (MACRODANTIN) 100 MG capsule Take 100 mg by mouth 2 (two) times daily.    [provider]  omeprazole (PRILOSEC) 20 MG capsule Take 20 mg by mouth daily.    [provider]  predniSONE  (DELTASONE ) 50 MG tablet 1 tablet PO daily 07/23/16   Rancour, Garnette, MD  promethazine -dextromethorphan (PROMETHAZINE -DM) 6.25-15 MG/5ML syrup Take 5 mLs by mouth at bedtime as needed for cough. 02/15/21   Christopher Savannah, PA-C  Propylene Glycol-Glycerin 0.6-0.6 % SOLN Apply 1 drop to eye daily as needed.    [provider]  pseudoephedrine  (SUDAFED) 30 MG tablet Take 1 tablet (30 mg total) by mouth every 8 (eight) hours as needed for congestion. 02/15/21   Christopher Savannah, PA-C    Allergies: Ceclor [cefaclor], Elemental sulfur, Penicillins, and Macrodantin [nitrofurantoin]    Review of Systems  Constitutional:  Negative for fever.  HENT:  Negative for congestion and sore throat.   Eyes: Negative.  Respiratory:  Negative for chest tightness and shortness of breath.   Cardiovascular:  Negative for chest pain, palpitations and leg swelling.  Gastrointestinal:  Negative for abdominal pain and nausea.  Genitourinary: Negative.   Musculoskeletal:  Negative for arthralgias, joint swelling and neck pain.  Skin:  Positive for wound. Negative for rash.  Neurological:  Positive for syncope. Negative for dizziness, weakness, light-headedness, numbness and headaches.  Psychiatric/Behavioral: Negative.      Updated Vital Signs BP (!) 143/78   Pulse 72   Temp  98.7 F (37.1 C) (Oral)   Resp 15   SpO2 96%   Physical Exam Vitals and nursing note reviewed.  Constitutional:      Appearance: She is well-developed.  HENT:     Head: Normocephalic and atraumatic.  Eyes:     Conjunctiva/sclera: Conjunctivae normal.  Cardiovascular:     Rate and Rhythm: Normal rate and regular rhythm.     Heart sounds: Normal heart sounds.     Comments: Observed on monitor, isolated PVC approximately every fifth beat. Pulmonary:     Effort: Pulmonary effort is normal.     Breath sounds: Normal breath sounds. No wheezing.  Abdominal:     General: Bowel sounds are normal.     Palpations: Abdomen is soft.     Tenderness: There is no abdominal tenderness.  Musculoskeletal:        General: No swelling, tenderness or deformity. Normal range of motion.     Cervical back: Normal range of motion.     Left knee: No swelling, deformity or crepitus.     Comments: Patient displays full range of motion of the left knee without complaint of pain.  She does have a quarter sized superficial abrasion at her proximal anterior tibia.  Skin:    General: Skin is warm and dry.  Neurological:     Mental Status: She is alert.     (all labs ordered are listed, but only abnormal results are displayed) Labs Reviewed  CBC WITH DIFFERENTIAL/PLATELET  BASIC METABOLIC PANEL WITH GFR  MAGNESIUM    EKG: EKG Interpretation Date/Time:  Friday April 26 2024 16:24:20 EST Ventricular Rate:  77 PR Interval:  151 QRS Duration:  99 QT Interval:  381 QTC Calculation: 432 R Axis:   87  Text Interpretation: Sinus rhythm Multiform ventricular premature complexes Borderline right axis deviation Low voltage, precordial leads Borderline T wave abnormalities Confirmed by Suzette Pac [phone]) on 04/26/2024 6:52:34 PM  Radiology: [facility] Chest Portable 1 View Result Date: 04/26/2024 EXAM: 1 VIEW(S) XRAY OF THE CHEST 04/26/2024 05:21:06 PM COMPARISON: None available. CLINICAL HISTORY: Syncope  Syncope syncope syncope FINDINGS: LUNGS AND PLEURA: No focal pulmonary opacity. No pleural effusion. No pneumothorax. HEART AND MEDIASTINUM: No acute abnormality of the cardiac and mediastinal silhouettes. BONES AND SOFT TISSUES: No acute osseous abnormality. IMPRESSION: 1. No acute process. Electronically signed by: Franky Crease MD 04/26/2024 05:44 PM EST RP Workstation: [id number]     Procedures   Medications Ordered in the ED  neomycin -bacitracin -polymyxin [phone] OINT (1 Application Apply externally Given 04/26/24 1808)                                    Medical Decision Making Patient presenting for evaluation of a trip and fall causing significant left knee pain which has since resolved.  Immediately after the fall she had a syncopal event after standing.  She has been sx free  since arriving here and has ambulated without knee pain or weakness/lightheadedness.  DDX including dehydration, cardiac arrhythmia,  syncope from pain response.  Labs,  imaging and EKG,  cardiac monitoring while here is reassuring.  Knee abrasion dressed, since pt ambulatory without knee pain,  imaging deferred.  She was given referral to cardiology given syncopal event.  I suspect this was a pain reaction however.   Amount and/or Complexity of Data Reviewed Labs: ordered.    Details: CBC, be met, magnesium levels all reviewed and normal. Radiology: ordered.    Details: Chest x-ray reviewed with no acute cardiopulmonary findings ECG/medicine tests: ordered.    Details: EKG reviewed, rate 77, PVCs present.  Risk OTC drugs.        Final diagnoses:  Syncope, unspecified syncope type  Abrasion of left knee, initial encounter  Multifocal PVCs    ED Discharge Orders     None          Birdena Mliss RIGGERS 04/28/24 [id number]    Suzette Pac, MD 04/28/24 1056  "

## 2024-05-08 ENCOUNTER — Telehealth: Payer: Self-pay | Admitting: Internal Medicine

## 2024-05-08 NOTE — Telephone Encounter (Signed)
 Tried to Lvm to r/s pcp out of office -aw

## 2024-06-24 ENCOUNTER — Ambulatory Visit: Admitting: Family

## 2024-07-15 ENCOUNTER — Ambulatory Visit: Admitting: Family
